# Patient Record
Sex: Female | Born: 1960 | Race: White | Hispanic: No | Marital: Married | Smoking: Never smoker
Health system: Southern US, Community
[De-identification: ages and names within clinical notes are randomized; demographics above are authoritative.]

## PROBLEM LIST (undated history)

## (undated) DIAGNOSIS — D649 Anemia, unspecified: Secondary | ICD-10-CM

## (undated) DIAGNOSIS — K219 Gastro-esophageal reflux disease without esophagitis: Secondary | ICD-10-CM

## (undated) DIAGNOSIS — D279 Benign neoplasm of unspecified ovary: Secondary | ICD-10-CM

## (undated) DIAGNOSIS — T7840XA Allergy, unspecified, initial encounter: Secondary | ICD-10-CM

## (undated) HISTORY — DX: Allergy, unspecified, initial encounter: T78.40XA

## (undated) HISTORY — DX: Benign neoplasm of unspecified ovary: D27.9

## (undated) HISTORY — DX: Anemia, unspecified: D64.9

## (undated) HISTORY — PX: ABDOMINAL HYSTERECTOMY: SHX81

## (undated) HISTORY — DX: Gastro-esophageal reflux disease without esophagitis: K21.9

---

## 2002-05-07 ENCOUNTER — Encounter: Admission: RE | Admit: 2002-05-07 | Discharge: 2002-05-07 | Payer: Self-pay | Admitting: Unknown Physician Specialty

## 2002-05-07 ENCOUNTER — Encounter: Payer: Self-pay | Admitting: Unknown Physician Specialty

## 2003-07-10 ENCOUNTER — Encounter: Admission: RE | Admit: 2003-07-10 | Discharge: 2003-07-10 | Payer: Self-pay | Admitting: Unknown Physician Specialty

## 2004-08-26 ENCOUNTER — Encounter: Admission: RE | Admit: 2004-08-26 | Discharge: 2004-08-26 | Payer: Self-pay | Admitting: Unknown Physician Specialty

## 2005-09-26 ENCOUNTER — Encounter: Admission: RE | Admit: 2005-09-26 | Discharge: 2005-09-26 | Payer: Self-pay | Admitting: Unknown Physician Specialty

## 2006-10-24 ENCOUNTER — Encounter: Admission: RE | Admit: 2006-10-24 | Discharge: 2006-10-24 | Payer: Self-pay | Admitting: Unknown Physician Specialty

## 2007-01-24 HISTORY — PX: OTHER SURGICAL HISTORY: SHX169

## 2007-10-28 ENCOUNTER — Encounter: Admission: RE | Admit: 2007-10-28 | Discharge: 2007-10-28 | Payer: Self-pay | Admitting: Unknown Physician Specialty

## 2008-11-19 ENCOUNTER — Encounter: Admission: RE | Admit: 2008-11-19 | Discharge: 2008-11-19 | Payer: Self-pay | Admitting: Unknown Physician Specialty

## 2009-06-01 DIAGNOSIS — C4491 Basal cell carcinoma of skin, unspecified: Secondary | ICD-10-CM

## 2009-06-01 HISTORY — DX: Basal cell carcinoma of skin, unspecified: C44.91

## 2009-11-29 ENCOUNTER — Encounter: Admission: RE | Admit: 2009-11-29 | Discharge: 2009-11-29 | Payer: Self-pay | Admitting: Unknown Physician Specialty

## 2010-11-15 ENCOUNTER — Other Ambulatory Visit: Payer: Self-pay | Admitting: Unknown Physician Specialty

## 2010-11-15 DIAGNOSIS — Z1231 Encounter for screening mammogram for malignant neoplasm of breast: Secondary | ICD-10-CM

## 2010-12-26 ENCOUNTER — Ambulatory Visit: Payer: Self-pay

## 2011-01-04 ENCOUNTER — Ambulatory Visit: Payer: Self-pay

## 2011-01-09 ENCOUNTER — Ambulatory Visit
Admission: RE | Admit: 2011-01-09 | Discharge: 2011-01-09 | Disposition: A | Discharge status: Home / Self Care | Payer: BC Managed Care – PPO | Source: Ambulatory Visit | Attending: Unknown Physician Specialty | Admitting: Unknown Physician Specialty

## 2011-01-09 DIAGNOSIS — Z1231 Encounter for screening mammogram for malignant neoplasm of breast: Secondary | ICD-10-CM

## 2011-10-23 ENCOUNTER — Other Ambulatory Visit: Payer: Self-pay | Admitting: Unknown Physician Specialty

## 2011-10-23 DIAGNOSIS — N644 Mastodynia: Secondary | ICD-10-CM

## 2011-10-23 DIAGNOSIS — N6320 Unspecified lump in the left breast, unspecified quadrant: Secondary | ICD-10-CM

## 2011-10-25 ENCOUNTER — Ambulatory Visit
Admission: RE | Admit: 2011-10-25 | Discharge: 2011-10-25 | Disposition: A | Discharge status: Home / Self Care | Payer: BC Managed Care – PPO | Source: Ambulatory Visit | Attending: Unknown Physician Specialty | Admitting: Unknown Physician Specialty

## 2011-10-25 DIAGNOSIS — N6320 Unspecified lump in the left breast, unspecified quadrant: Secondary | ICD-10-CM

## 2011-10-25 DIAGNOSIS — N644 Mastodynia: Secondary | ICD-10-CM

## 2011-12-27 ENCOUNTER — Other Ambulatory Visit: Payer: Self-pay | Admitting: Unknown Physician Specialty

## 2011-12-27 DIAGNOSIS — Z1231 Encounter for screening mammogram for malignant neoplasm of breast: Secondary | ICD-10-CM

## 2012-03-05 ENCOUNTER — Ambulatory Visit
Admission: RE | Admit: 2012-03-05 | Discharge: 2012-03-05 | Disposition: A | Discharge status: Home / Self Care | Payer: BC Managed Care – PPO | Source: Ambulatory Visit | Attending: Unknown Physician Specialty | Admitting: Unknown Physician Specialty

## 2012-03-05 DIAGNOSIS — Z1231 Encounter for screening mammogram for malignant neoplasm of breast: Secondary | ICD-10-CM

## 2012-12-24 ENCOUNTER — Other Ambulatory Visit: Payer: Self-pay

## 2012-12-24 DIAGNOSIS — Z1231 Encounter for screening mammogram for malignant neoplasm of breast: Secondary | ICD-10-CM

## 2013-03-06 ENCOUNTER — Ambulatory Visit: Payer: BC Managed Care – PPO

## 2013-03-10 ENCOUNTER — Ambulatory Visit
Admission: RE | Admit: 2013-03-10 | Discharge: 2013-03-10 | Disposition: A | Discharge status: Home / Self Care | Payer: BC Managed Care – PPO | Source: Ambulatory Visit

## 2013-03-10 ENCOUNTER — Ambulatory Visit: Payer: BC Managed Care – PPO

## 2013-03-10 DIAGNOSIS — Z1231 Encounter for screening mammogram for malignant neoplasm of breast: Secondary | ICD-10-CM

## 2013-03-13 ENCOUNTER — Other Ambulatory Visit: Payer: Self-pay

## 2013-03-13 DIAGNOSIS — Z1231 Encounter for screening mammogram for malignant neoplasm of breast: Secondary | ICD-10-CM

## 2014-03-02 ENCOUNTER — Other Ambulatory Visit: Payer: Self-pay

## 2014-03-02 DIAGNOSIS — Z1231 Encounter for screening mammogram for malignant neoplasm of breast: Secondary | ICD-10-CM

## 2014-03-16 ENCOUNTER — Ambulatory Visit
Admission: RE | Admit: 2014-03-16 | Discharge: 2014-03-16 | Disposition: A | Discharge status: Home / Self Care | Payer: BC Managed Care – PPO | Source: Ambulatory Visit

## 2014-03-16 ENCOUNTER — Encounter (INDEPENDENT_AMBULATORY_CARE_PROVIDER_SITE_OTHER): Payer: Self-pay

## 2014-03-16 DIAGNOSIS — Z1231 Encounter for screening mammogram for malignant neoplasm of breast: Secondary | ICD-10-CM

## 2014-05-24 HISTORY — PX: COLONOSCOPY: SHX174

## 2014-06-05 ENCOUNTER — Ambulatory Visit
Admission: RE | Admit: 2014-06-05 | Discharge: 2014-06-05 | Disposition: A | Discharge status: Home / Self Care | Payer: BC Managed Care – PPO | Source: Ambulatory Visit | Attending: Otolaryngology | Admitting: Otolaryngology

## 2014-06-05 ENCOUNTER — Other Ambulatory Visit: Payer: Self-pay | Admitting: Otolaryngology

## 2014-06-05 DIAGNOSIS — H9311 Tinnitus, right ear: Secondary | ICD-10-CM

## 2014-06-05 MED ORDER — GADOBENATE DIMEGLUMINE 529 MG/ML IV SOLN
15.0000 mL | Freq: Once | INTRAVENOUS | Status: AC | PRN
  Administered 2014-06-05: 15 mL via INTRAVENOUS

## 2014-06-10 ENCOUNTER — Other Ambulatory Visit: Payer: BC Managed Care – PPO

## 2015-03-04 ENCOUNTER — Other Ambulatory Visit: Payer: Self-pay

## 2015-03-04 DIAGNOSIS — Z1231 Encounter for screening mammogram for malignant neoplasm of breast: Secondary | ICD-10-CM

## 2015-03-26 ENCOUNTER — Ambulatory Visit
Admission: RE | Admit: 2015-03-26 | Discharge: 2015-03-26 | Disposition: A | Discharge status: Home / Self Care | Payer: BC Managed Care – PPO | Source: Ambulatory Visit

## 2015-03-26 DIAGNOSIS — Z1231 Encounter for screening mammogram for malignant neoplasm of breast: Secondary | ICD-10-CM

## 2016-04-04 ENCOUNTER — Other Ambulatory Visit: Payer: Self-pay | Admitting: Unknown Physician Specialty

## 2016-04-04 ENCOUNTER — Other Ambulatory Visit: Payer: Self-pay | Admitting: Internal Medicine

## 2016-04-04 DIAGNOSIS — Z1231 Encounter for screening mammogram for malignant neoplasm of breast: Secondary | ICD-10-CM

## 2016-04-25 ENCOUNTER — Ambulatory Visit
Admission: RE | Admit: 2016-04-25 | Discharge: 2016-04-25 | Disposition: A | Discharge status: Home / Self Care | Payer: BC Managed Care – PPO | Source: Ambulatory Visit | Attending: Unknown Physician Specialty | Admitting: Unknown Physician Specialty

## 2016-04-25 DIAGNOSIS — Z1231 Encounter for screening mammogram for malignant neoplasm of breast: Secondary | ICD-10-CM

## 2017-03-19 ENCOUNTER — Other Ambulatory Visit: Payer: Self-pay | Admitting: Unknown Physician Specialty

## 2017-03-19 DIAGNOSIS — Z1231 Encounter for screening mammogram for malignant neoplasm of breast: Secondary | ICD-10-CM

## 2017-04-30 ENCOUNTER — Ambulatory Visit
Admission: RE | Admit: 2017-04-30 | Discharge: 2017-04-30 | Disposition: A | Discharge status: Home / Self Care | Payer: BC Managed Care – PPO | Source: Ambulatory Visit | Attending: Unknown Physician Specialty | Admitting: Unknown Physician Specialty

## 2017-04-30 DIAGNOSIS — Z1231 Encounter for screening mammogram for malignant neoplasm of breast: Secondary | ICD-10-CM

## 2017-05-01 ENCOUNTER — Ambulatory Visit: Payer: BC Managed Care – PPO

## 2017-10-05 ENCOUNTER — Other Ambulatory Visit: Payer: Self-pay | Admitting: Unknown Physician Specialty

## 2017-10-05 DIAGNOSIS — N6320 Unspecified lump in the left breast, unspecified quadrant: Secondary | ICD-10-CM

## 2017-10-08 ENCOUNTER — Ambulatory Visit
Admission: RE | Admit: 2017-10-08 | Discharge: 2017-10-08 | Disposition: A | Discharge status: Home / Self Care | Payer: BC Managed Care – PPO | Source: Ambulatory Visit | Attending: Unknown Physician Specialty | Admitting: Unknown Physician Specialty

## 2017-10-08 DIAGNOSIS — N6320 Unspecified lump in the left breast, unspecified quadrant: Secondary | ICD-10-CM

## 2017-12-17 ENCOUNTER — Encounter (INDEPENDENT_AMBULATORY_CARE_PROVIDER_SITE_OTHER): Payer: Self-pay | Admitting: Internal Medicine

## 2017-12-17 ENCOUNTER — Ambulatory Visit (INDEPENDENT_AMBULATORY_CARE_PROVIDER_SITE_OTHER): Payer: BC Managed Care – PPO | Admitting: Internal Medicine

## 2017-12-17 ENCOUNTER — Other Ambulatory Visit (INDEPENDENT_AMBULATORY_CARE_PROVIDER_SITE_OTHER): Payer: Self-pay | Admitting: *Deleted

## 2017-12-17 ENCOUNTER — Encounter (INDEPENDENT_AMBULATORY_CARE_PROVIDER_SITE_OTHER): Payer: Self-pay | Admitting: *Deleted

## 2017-12-17 VITALS — BP 118/80 | HR 70 | Temp 99.4°F | Resp 18 | Ht 64.0 in | Wt 147.2 lb

## 2017-12-17 DIAGNOSIS — Z8349 Family history of other endocrine, nutritional and metabolic diseases: Secondary | ICD-10-CM

## 2017-12-17 DIAGNOSIS — Z8379 Family history of other diseases of the digestive system: Secondary | ICD-10-CM

## 2017-12-17 NOTE — Patient Instructions (Signed)
Ultrasound of upper abdomen to be scheduled. Physician will call with results of blood test when completed.

## 2017-12-17 NOTE — Progress Notes (Signed)
Presenting complaint;  Family history of liver disease and hemochromatosis.  History of present illness:  Patient is 57 year old Caucasian female who is self-referred for GI evaluation.  She recently found out that 3 of her first cousins had cirrhosis in 1 of them also had hemochromatosis diagnosed on liver biopsy done because of liver lesions determined to be due to primary esophageal adenocarcinoma.  She just died she was 57 years old. Etiology of cirrhosis has not been is established and her other cousins.  One of her cousins is in her 61s.  She states another cousin of her was diagnosed with another genetic disorder she does not have liver disease. He has kept records of her blood work for close to 15 years.  She has had anemia dating back to age 56 possibly related to toxin exposure.  Her transaminases have always been normal.  She realizes that she does not have hemochromatosis but she wants to find out if she carries single HFE which may be important information for her children. Her last menstrual period was in 2009. Her appetite is normal.  She retired in January this year and since then she has lost 20 pounds.  She states she has lost weight because of this of regular exercise and her eating habits have improved.  She denies heartburn dysphagia nausea vomiting abdominal pain melena or rectal bleeding.  She has occasional urgency and had at least one accident about 2 years ago when her father was ill.  She has never been diagnosed with irritable bowel syndrome. Last colonoscopy was in 2016 with removal of single adenoma.  She was advised to return for surveillance exam in 5 years or 2021.  Current Medications: No outpatient encounter medications on file as of 12/17/2017.   No facility-administered encounter medications on file as of 12/17/2017.    Past medical history: History of anemia dating back to age 57. She had pseudomembranous colitis back in 1972. C-section in 1985. She had  endometrial ablation in 2009. Screening colonoscopy in May 2016 with removal of single adenoma.  Allergies: Allergies  Allergen Reactions  . Penicillins Rash    Family history: Father lived to be 78 and died 2 years ago of MI.  Mother is 1 years old.  She lives at home.  She has spinal stenosis osteoarthrosis peripheral vascular disease and benign monoclonal gammopathy. Patient does not have any siblings.  Paternal aunt died of colorectal carcinoma in her 59s. One first cousin was diagnosed with esophageal carcinoma metastatic to liver and diet at 60 this year.  She had liver biopsy and was found to have hemochromatosis.  Her daughter was tested for hemochromatosis and tests were negative. She has 2 other cousins with cirrhosis.(One is in her 13s and the other one is 59 years old). Another first cousin has H63D mutation but does not have full-blown disorder.  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  Prior to that she worked for 11 years at [employer] and she also worked 7 years elsewhere. She is married.  Her husband is in good health.  They have 2 sons ages 25 and 67 and they are in good health.  She has never smoked cigarettes and drinks alcohol occasionally. Since she has retired she has been jogging and walking regularly and she also does aerobic exercises and Zumba.     Objective: Blood pressure 118/80, pulse 70, temperature 99.4 F (  37.4 C), temperature source Oral, resp. rate 18, height 5\' 4"  (1.626 m), weight 147 lb 3.2 oz (66.8 kg). Patient is alert and in no acute distress. Conjunctiva is pink. Sclera is nonicteric Oropharyngeal mucosa is normal. No neck masses or thyromegaly noted. Cardiac exam with regular rhythm normal S1 and S2. No murmur or gallop noted. Lungs are clear to auscultation. Abdomen is symmetrical.  On palpation is soft and nontender without masses  organomegaly. No LE edema or clubbing noted.  Labs/studies Results:  Serum iron levels are as follows: 48 in 2005, 35 in 2006, 30 in 2007, 60 in 2008, 56 in 2009, 61 in 2010, 95 in 2011, 74 in 2012, 57 in 2013 and 73 in 2014.  Recent lab data from 04/04/2016 WBC 4.8, H&H 11 and 33.9 and platelet count 371K.  LFTs from 04/14/2014 Bilirubin 0.4, AP 59, AST 16, ALT 12, total protein 6.2 and albumin 4.0  She has LFTs going back to February 2005 and they have always been normal.  Patient has these records.  Assessment:  #1.  Family history of chronic liver disease as well as hemochromatosis.  Patient does not have stigmata of chronic liver disease.  Serum iron levels have been normal.  She did receive hepatitis B vaccination.  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  We will also check iron studies and proceed with abdominal ultrasound and genetic testing as it is important to know for the sake of her children.  I do not believe she has hemochromatosis based on all the blood work that she has had over the last 14 years.  Given her family history she could have single mutation but it would not be enough to cause disease.  #2.  History of colonic adenoma.  She will be due for next surveillance colonoscopy in May 2021.   Plan:  Upper abdominal ultrasound. Hepatitis B surface antigen, hepatitis B surface antibody and HCV antibody. Genetic testing for hemochromatosis. Office visit in 1 year.

## 2017-12-18 ENCOUNTER — Ambulatory Visit (INDEPENDENT_AMBULATORY_CARE_PROVIDER_SITE_OTHER): Payer: BC Managed Care – PPO | Admitting: Internal Medicine

## 2017-12-23 LAB — HEPATITIS C ANTIBODY
Hepatitis C Ab: NONREACTIVE
SIGNAL TO CUT-OFF: 0.01 (ref <1.00)

## 2017-12-23 LAB — HEPATITIS B SURFACE ANTIBODY,QUALITATIVE: Hep B S Ab: REACTIVE — AB

## 2017-12-23 LAB — HEMOCHROMATOSIS DNA-PCR(C282Y,H63D)

## 2017-12-23 LAB — HEPATITIS B SURFACE ANTIGEN: Hepatitis B Surface Ag: NONREACTIVE

## 2017-12-25 ENCOUNTER — Ambulatory Visit (HOSPITAL_COMMUNITY)
Admission: RE | Admit: 2017-12-25 | Discharge: 2017-12-25 | Disposition: A | Discharge status: Home / Self Care | Payer: BC Managed Care – PPO | Source: Ambulatory Visit | Attending: Internal Medicine | Admitting: Internal Medicine

## 2017-12-25 DIAGNOSIS — Z8379 Family history of other diseases of the digestive system: Secondary | ICD-10-CM | POA: Diagnosis not present

## 2017-12-25 DIAGNOSIS — R1907 Generalized intra-abdominal and pelvic swelling, mass and lump: Secondary | ICD-10-CM | POA: Insufficient documentation

## 2017-12-25 DIAGNOSIS — Z8349 Family history of other endocrine, nutritional and metabolic diseases: Secondary | ICD-10-CM | POA: Insufficient documentation

## 2017-12-28 ENCOUNTER — Other Ambulatory Visit (INDEPENDENT_AMBULATORY_CARE_PROVIDER_SITE_OTHER): Payer: Self-pay | Admitting: Internal Medicine

## 2017-12-28 ENCOUNTER — Telehealth (INDEPENDENT_AMBULATORY_CARE_PROVIDER_SITE_OTHER): Payer: Self-pay | Admitting: *Deleted

## 2017-12-28 DIAGNOSIS — R1907 Generalized intra-abdominal and pelvic swelling, mass and lump: Secondary | ICD-10-CM

## 2017-12-28 NOTE — Telephone Encounter (Signed)
Patient wants to know if you need to discuss [place] results with Dr Barrie Dunker to see if she needs pelvic US

## 2017-12-31 ENCOUNTER — Ambulatory Visit (HOSPITAL_COMMUNITY)
Admission: RE | Admit: 2017-12-31 | Discharge: 2017-12-31 | Disposition: A | Discharge status: Home / Self Care | Payer: BC Managed Care – PPO | Source: Ambulatory Visit | Attending: Internal Medicine | Admitting: Internal Medicine

## 2017-12-31 DIAGNOSIS — R1907 Generalized intra-abdominal and pelvic swelling, mass and lump: Secondary | ICD-10-CM | POA: Insufficient documentation

## 2017-12-31 MED ORDER — IOPAMIDOL (ISOVUE-300) INJECTION 61%
100.0000 mL | Freq: Once | INTRAVENOUS | Status: AC | PRN
  Administered 2017-12-31: 100 mL via INTRAVENOUS

## 2018-01-06 NOTE — Telephone Encounter (Signed)
Already addressed with patient. 

## 2018-03-05 ENCOUNTER — Telehealth (INDEPENDENT_AMBULATORY_CARE_PROVIDER_SITE_OTHER): Payer: Self-pay | Admitting: Internal Medicine

## 2018-03-05 NOTE — Telephone Encounter (Signed)
Patient called stated she had a CT scan and the results showed some liver lesions - stated she doesn't know if she should follow up with you.  Ph# [phone]

## 2018-03-08 NOTE — Telephone Encounter (Signed)
Patient called. She had a tiny lesion in the liver possibly small cyst.  No further work-up indicated at this time. He had abdominal hysterectomy with oophorectomy.  She had a very large cystadenoma of the ovary. She will be returning for office visit in November this year.

## 2018-03-18 ENCOUNTER — Other Ambulatory Visit: Payer: Self-pay | Admitting: Unknown Physician Specialty

## 2018-03-18 DIAGNOSIS — Z1231 Encounter for screening mammogram for malignant neoplasm of breast: Secondary | ICD-10-CM

## 2018-03-20 ENCOUNTER — Ambulatory Visit: Payer: BC Managed Care – PPO | Admitting: Internal Medicine

## 2018-03-20 ENCOUNTER — Encounter: Payer: Self-pay | Admitting: Internal Medicine

## 2018-03-20 VITALS — BP 120/78 | HR 95 | Temp 98.9°F | Ht 65.0 in | Wt 140.3 lb

## 2018-03-20 DIAGNOSIS — D279 Benign neoplasm of unspecified ovary: Secondary | ICD-10-CM

## 2018-03-20 DIAGNOSIS — Z23 Encounter for immunization: Secondary | ICD-10-CM

## 2018-03-20 NOTE — Patient Instructions (Signed)
-  Nice meeting you today!  -1/2 shingles vaccines today.  -Schedule follow up in 3 months for your physical. Please come in fasting to that appointment.

## 2018-03-20 NOTE — Addendum Note (Signed)
Addended by: Westley Hummer B on: 03/20/2018 02:12 PM   Modules accepted: Orders

## 2018-03-20 NOTE — Progress Notes (Signed)
New Patient Office Visit     CC/Reason for Visit: Establish care, follow up on chronic conditions Previous PCP: [facility] in [place], [place] Last Visit: Fall 2019  HPI: Danielle Gutierrez is a 58 y.o. female who is coming in today for the above mentioned reasons. 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 Over the past year she has lost over 20 pounds with lifestyle modifications. She takes no medications and has no chronic medical issues. Never smoker, ETOH occasionally (1 glass of wine a week), no illicit substances. 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 Fam Hx is significant for HTN and HLD in father, multiple 1st cousins with cirrhosis, 1 of them diagnosed with hemachromatosis, 1 cousin passed with esophageal cancer. She has no acute complaints today.  Past Medical/Surgical History: Past Medical History:  Diagnosis Date  . Serous cystadenoma of ovary     Past Surgical History:  Procedure Laterality Date  . CESAREAN SECTION  1985  . COLONOSCOPY  05/2014   [facility]  . Uterine ablasion  2009  2009    Social History:  reports that she has never smoked. She has never used smokeless tobacco. She reports current alcohol use. She reports that she does not use drugs.  Allergies: Allergies  Allergen Reactions  . Penicillins Rash    Family History:  Family History  Adopted: Yes  Problem Relation Age of Onset  . Breast cancer Maternal Aunt 75  . Arthritis Mother   . Peripheral vascular disease Mother      Current Outpatient Medications:  .  fluticasone (FLONASE) 50 MCG/ACT nasal spray, Place into the nose., Disp: , Rfl:   Review of Systems:  Constitutional: Denies fever, chills, diaphoresis, appetite change and fatigue.  HEENT: Denies photophobia, eye pain, redness, hearing loss, ear pain, congestion, sore throat,  rhinorrhea, sneezing, mouth sores, trouble swallowing, neck pain, neck stiffness and tinnitus.   Respiratory: Denies SOB, DOE, cough, chest tightness,  and wheezing.   Cardiovascular: Denies chest pain, palpitations and leg swelling.  Gastrointestinal: Denies nausea, vomiting, abdominal pain, diarrhea, constipation, blood in stool and abdominal distention.  Genitourinary: Denies dysuria, urgency, frequency, hematuria, flank pain and difficulty urinating.  Endocrine: Denies: hot or cold intolerance, sweats, changes in hair or nails, polyuria, polydipsia. Musculoskeletal: Denies myalgias, back pain, joint swelling, arthralgias and gait problem.  Skin: Denies pallor, rash and wound.  Neurological: Denies dizziness, seizures, syncope, weakness, light-headedness, numbness and headaches.  Hematological: Denies adenopathy. Easy bruising, personal or family bleeding history  Psychiatric/Behavioral: Denies suicidal ideation, mood changes, confusion, nervousness, sleep disturbance and agitation    Physical Exam: Vitals:   03/20/18 1254  BP: 120/78  Pulse: 95  Temp: 98.9 F (37.2 C)  TempSrc: Oral  SpO2: 97%  Weight: 140 lb 4.8 oz (63.6 kg)  Height: 5\' 5"  (1.651 m)   Body mass index is 23.35 kg/m.  Constitutional: NAD, calm, comfortable Eyes: PERRL, lids and conjunctivae normal ENMT: Mucous membranes are moist.  Respiratory: clear to auscultation bilaterally, no wheezing, no crackles. Normal respiratory effort. No accessory muscle use.  Cardiovascular: Regular rate and rhythm, no murmurs / rubs / gallops. No extremity edema. 2+ pedal pulses. No carotid bruits.  Psychiatric: Normal judgment and insight. Alert and oriented x 3. Normal mood.    Impression and Plan:  Serous cystadenoma of ovary, unspecified laterality -  Removed in 12/19. -Path was benign.  She will receive 1/2 shingles vaccines today. Will return in 3 months for CPE.     Patient Instructions  -Nice meeting you  today!  -1/2 shingles vaccines today.  -Schedule follow up in 3 months for your physical. Please come in fasting to that appointment.     Lelon Frohlich, MD [facility]

## 2018-05-02 ENCOUNTER — Ambulatory Visit: Payer: BC Managed Care – PPO

## 2018-05-22 ENCOUNTER — Ambulatory Visit: Payer: BC Managed Care – PPO

## 2018-06-25 ENCOUNTER — Encounter: Payer: BC Managed Care – PPO | Admitting: Internal Medicine

## 2018-06-26 ENCOUNTER — Encounter: Payer: Self-pay | Admitting: Internal Medicine

## 2018-06-26 ENCOUNTER — Other Ambulatory Visit: Payer: Self-pay

## 2018-06-26 ENCOUNTER — Ambulatory Visit (INDEPENDENT_AMBULATORY_CARE_PROVIDER_SITE_OTHER): Payer: BC Managed Care – PPO | Admitting: Internal Medicine

## 2018-06-26 VITALS — BP 102/68 | HR 58 | Temp 97.5°F | Ht 65.0 in | Wt 142.8 lb

## 2018-06-26 DIAGNOSIS — Z23 Encounter for immunization: Secondary | ICD-10-CM

## 2018-06-26 DIAGNOSIS — Z8249 Family history of ischemic heart disease and other diseases of the circulatory system: Secondary | ICD-10-CM

## 2018-06-26 DIAGNOSIS — Z13820 Encounter for screening for osteoporosis: Secondary | ICD-10-CM | POA: Diagnosis not present

## 2018-06-26 DIAGNOSIS — Z0000 Encounter for general adult medical examination without abnormal findings: Secondary | ICD-10-CM | POA: Diagnosis not present

## 2018-06-26 DIAGNOSIS — Z148 Genetic carrier of other disease: Secondary | ICD-10-CM | POA: Diagnosis not present

## 2018-06-26 LAB — COMPREHENSIVE METABOLIC PANEL
ALT: 15 U/L (ref 0–35)
AST: 19 U/L (ref 0–37)
Albumin: 4.2 g/dL (ref 3.5–5.2)
Alkaline Phosphatase: 51 U/L (ref 39–117)
BUN: 14 mg/dL (ref 6–23)
CO2: 27 mEq/L (ref 19–32)
Calcium: 9.4 mg/dL (ref 8.4–10.5)
Chloride: 103 mEq/L (ref 96–112)
Creatinine, Ser: 0.9 mg/dL (ref 0.40–1.20)
GFR: 63.9 mL/min (ref >60.00)
Glucose, Bld: 90 mg/dL (ref 70–99)
Potassium: 4.4 mEq/L (ref 3.5–5.1)
Sodium: 138 mEq/L (ref 135–145)
Total Bilirubin: 1.1 mg/dL (ref 0.2–1.2)
Total Protein: 6.8 g/dL (ref 6.0–8.3)

## 2018-06-26 LAB — CBC WITH DIFFERENTIAL/PLATELET
Basophils Absolute: 0 10*3/uL (ref 0.0–0.1)
Basophils Relative: 0.3 % (ref 0.0–3.0)
Eosinophils Absolute: 0.1 10*3/uL (ref 0.0–0.7)
Eosinophils Relative: 1.6 % (ref 0.0–5.0)
HCT: 34.9 % — ABNORMAL LOW (ref 36.0–46.0)
Hemoglobin: 11.8 g/dL — ABNORMAL LOW (ref 12.0–15.0)
Lymphocytes Relative: 31.7 % (ref 12.0–46.0)
Lymphs Abs: 1.9 10*3/uL (ref 0.7–4.0)
MCHC: 33.8 g/dL (ref 30.0–36.0)
MCV: 94.6 fl (ref 78.0–100.0)
Monocytes Absolute: 0.5 10*3/uL (ref 0.1–1.0)
Monocytes Relative: 7.6 % (ref 3.0–12.0)
Neutro Abs: 3.6 10*3/uL (ref 1.4–7.7)
Neutrophils Relative %: 58.8 % (ref 43.0–77.0)
Platelets: 367 10*3/uL (ref 150.0–400.0)
RBC: 3.69 Mil/uL — ABNORMAL LOW (ref 3.87–5.11)
RDW: 12.8 % (ref 11.5–15.5)
WBC: 6 10*3/uL (ref 4.0–10.5)

## 2018-06-26 LAB — VITAMIN B12: Vitamin B-12: 219 pg/mL (ref 211–911)

## 2018-06-26 LAB — LIPID PANEL
Cholesterol: 212 mg/dL — ABNORMAL HIGH (ref 0–200)
HDL: 77.2 mg/dL (ref >39.00)
LDL Cholesterol: 120 mg/dL — ABNORMAL HIGH (ref 0–99)
NonHDL: 135.13
Total CHOL/HDL Ratio: 3
Triglycerides: 75 mg/dL (ref 0.0–149.0)
VLDL: 15 mg/dL (ref 0.0–40.0)

## 2018-06-26 LAB — VITAMIN D 25 HYDROXY (VIT D DEFICIENCY, FRACTURES): VITD: 35.77 ng/mL (ref 30.00–100.00)

## 2018-06-26 LAB — TSH: TSH: 1.07 u[IU]/mL (ref 0.35–4.50)

## 2018-06-26 NOTE — Addendum Note (Signed)
Addended by: Elmer Picker on: 06/26/2018 10:02 AM   Modules accepted: Orders

## 2018-06-26 NOTE — Patient Instructions (Signed)
-It was nice seeing you today!  -Lab work today; will notify you when results are available.  -Bone density test will be requested.  -Make sure you have routine eye and dental care.  -Second singles vaccine today.   Preventive Care 40-64 Years, Female Preventive care refers to lifestyle choices and visits with your health care provider that can promote health and wellness. What does preventive care include?   A yearly physical exam. This is also called an annual well check.  Dental exams once or twice a year.  Routine eye exams. Ask your health care provider how often you should have your eyes checked.  Personal lifestyle choices, including: ? Daily care of your teeth and gums. ? Regular physical activity. ? Eating a healthy diet. ? Avoiding tobacco and drug use. ? Limiting alcohol use. ? Practicing safe sex. ? Taking low-dose aspirin daily starting at age 24. ? Taking vitamin and mineral supplements as recommended by your health care provider. What happens during an annual well check? The services and screenings done by your health care provider during your annual well check will depend on your age, overall health, lifestyle risk factors, and family history of disease. Counseling Your health care provider may ask you questions about your:  Alcohol use.  Tobacco use.  Drug use.  Emotional well-being.  Home and relationship well-being.  Sexual activity.  Eating habits.  Work and work Statistician.  Method of birth control.  Menstrual cycle.  Pregnancy history. Screening You may have the following tests or measurements:  Height, weight, and BMI.  Blood pressure.  Lipid and cholesterol levels. These may be checked every 5 years, or more frequently if you are over 24 years old.  Skin check.  Lung cancer screening. You may have this screening every year starting at age 27 if you have a 30-pack-year history of smoking and currently smoke or have quit  within the past 15 years.  Colorectal cancer screening. All adults should have this screening starting at age 72 and continuing until age 20. Your health care provider may recommend screening at age 59. You will have tests every 1-10 years, depending on your results and the type of screening test. People at increased risk should start screening at an earlier age. Screening tests may include: ? Guaiac-based fecal occult blood testing. ? Fecal immunochemical test (FIT). ? Stool DNA test. ? Virtual colonoscopy. ? Sigmoidoscopy. During this test, a flexible tube with a tiny camera (sigmoidoscope) is used to examine your rectum and lower colon. The sigmoidoscope is inserted through your anus into your rectum and lower colon. ? Colonoscopy. During this test, a long, thin, flexible tube with a tiny camera (colonoscope) is used to examine your entire colon and rectum.  Hepatitis C blood test.  Hepatitis B blood test.  Sexually transmitted disease (STD) testing.  Diabetes screening. This is done by checking your blood sugar (glucose) after you have not eaten for a while (fasting). You may have this done every 1-3 years.  Mammogram. This may be done every 1-2 years. Talk to your health care provider about when you should start having regular mammograms. This may depend on whether you have a family history of breast cancer.  BRCA-related cancer screening. This may be done if you have a family history of breast, ovarian, tubal, or peritoneal cancers.  Pelvic exam and Pap test. This may be done every 3 years starting at age 41. Starting at age 76, this may be done every 5 years if  you have a Pap test in combination with an HPV test.  Bone density scan. This is done to screen for osteoporosis. You may have this scan if you are at high risk for osteoporosis. Discuss your test results, treatment options, and if necessary, the need for more tests with your health care provider. Vaccines Your health care  provider may recommend certain vaccines, such as:  Influenza vaccine. This is recommended every year.  Tetanus, diphtheria, and acellular pertussis (Tdap, Td) vaccine. You may need a Td booster every 10 years.  Varicella vaccine. You may need this if you have not been vaccinated.  Zoster vaccine. You may need this after age 44.  Measles, mumps, and rubella (MMR) vaccine. You may need at least one dose of MMR if you were born in 1957 or later. You may also need a second dose.  Pneumococcal 13-valent conjugate (PCV13) vaccine. You may need this if you have certain conditions and were not previously vaccinated.  Pneumococcal polysaccharide (PPSV23) vaccine. You may need one or two doses if you smoke cigarettes or if you have certain conditions.  Meningococcal vaccine. You may need this if you have certain conditions.  Hepatitis A vaccine. You may need this if you have certain conditions or if you travel or work in places where you may be exposed to hepatitis A.  Hepatitis B vaccine. You may need this if you have certain conditions or if you travel or work in places where you may be exposed to hepatitis B.  Haemophilus influenzae type b (Hib) vaccine. You may need this if you have certain conditions. Talk to your health care provider about which screenings and vaccines you need and how often you need them. This information is not intended to replace advice given to you by your health care provider. Make sure you discuss any questions you have with your health care provider. Document Released: 02/05/2015 Document Revised: 03/01/2017 Document Reviewed: 11/10/2014 Elsevier Interactive Patient Education  2019 [employer].

## 2018-06-26 NOTE — Addendum Note (Signed)
Addended by: Westley Hummer B on: 06/26/2018 11:06 AM   Modules accepted: Orders

## 2018-06-26 NOTE — Progress Notes (Signed)
Established Patient Office Visit     CC/Reason for Visit: Annual preventive exam  HPI: Danielle Gutierrez is a 58 y.o. female who is coming in today for the above mentioned reasons. 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  She wonders what implications this has in her family members as she is expecting her first grandchild soon.  Other than that she has no acute complaints.  She has routine eye and dental care, she is due for screening mammogram in July 2020, all immunizations are up-to-date, due for repeat colonoscopy in 2021.  She is going through a difficult and stressful situation with her mother who is ill.   Past Medical/Surgical History: Past Medical History:  Diagnosis Date  . Serous cystadenoma of ovary     Past Surgical History:  Procedure Laterality Date  . CESAREAN SECTION  1985  . COLONOSCOPY  05/2014   [facility]  . Uterine ablasion  2009  2009    Social History:  reports that she has never smoked. She has never used smokeless tobacco. She reports current alcohol use. She reports that she does not use drugs.  Allergies: Allergies  Allergen Reactions  . Penicillins Rash    Family History:  Family History  Adopted: Yes  Problem Relation Age of Onset  . Breast cancer Maternal Aunt 75  . Arthritis Mother   . Peripheral vascular disease Mother      Current Outpatient Medications:  .  fluticasone (FLONASE) 50 MCG/ACT nasal spray, Place into the nose., Disp: , Rfl:   Review of Systems:  Constitutional: Denies fever, chills, diaphoresis, appetite change and fatigue.  HEENT: Denies photophobia, eye pain, redness, hearing loss, ear pain, congestion, sore throat, rhinorrhea, sneezing, mouth sores, trouble swallowing, neck pain, neck stiffness and tinnitus.    Respiratory: Denies SOB, DOE, cough, chest tightness,  and wheezing.   Cardiovascular: Denies chest pain, palpitations and leg swelling.  Gastrointestinal: Denies nausea, vomiting, abdominal pain, diarrhea, constipation, blood in stool and abdominal distention.  Genitourinary: Denies dysuria, urgency, frequency, hematuria, flank pain and difficulty urinating.  Endocrine: Denies: hot or cold intolerance, sweats, changes in hair or nails, polyuria, polydipsia. Musculoskeletal: Denies myalgias, back pain, joint swelling, arthralgias and gait problem.  Skin: Denies pallor, rash and wound.  Neurological: Denies dizziness, seizures, syncope, weakness, light-headedness, numbness and headaches.  Hematological: Denies adenopathy. Easy bruising, personal or family bleeding history  Psychiatric/Behavioral: Denies suicidal ideation, mood changes, confusion, nervousness, sleep disturbance and agitation    Physical Exam: Vitals:   06/26/18 0920  BP: 102/68  Pulse: (!) 58  Temp: (!) 97.5 F (36.4 C)  TempSrc: Oral  SpO2: 99%  Weight: 142 lb 12.8 oz (64.8 kg)  Height: _0  (1.651 m)    Body mass index is 23.76 kg/m.   Constitutional: NAD, calm, comfortable Eyes: PERRL, lids and conjunctivae normal ENMT: Mucous membranes are moist. Posterior pharynx clear of any exudate or lesions. Normal dentition. Tympanic membrane is pearly white, no erythema or bulging. Neck: normal, supple, no masses, no thyromegaly Respiratory: clear to auscultation bilaterally, no wheezing, no crackles. Normal respiratory effort. No accessory muscle use.  Cardiovascular: Regular rate and rhythm, no murmurs / rubs / gallops. No extremity edema. 2+ pedal pulses. No carotid bruits.  Abdomen: no tenderness, no masses palpated. No hepatosplenomegaly. Bowel sounds positive.  Musculoskeletal:  no clubbing / cyanosis. No joint deformity upper and lower extremities. Good ROM, no contractures. Normal muscle tone.  Skin: no rashes,  lesions, ulcers. No induration Neurologic: CN 2-12 grossly intact. Sensation intact, DTR normal. Strength 5/5 in all 4.  Psychiatric: Normal judgment and insight. Alert and oriented x 3. Normal mood.    Impression and Plan:  Encounter for preventive health examination  -Screening labs to be done today. -DEXA scan requested. -Up-to-date on immunizations and cancer screening. -She has routine eye and dental care. -We have discussed healthy lifestyle choices.  Screening for osteoporosis - Plan: DG Bone Density, VITAMIN D 25 Hydroxy (Vit-D Deficiency, Fractures)  Hemochromatosis carrier -Have discussed that she and her family may benefit from some genetic counseling.     Patient Instructions  -It was nice seeing you today!  -Lab work today; will notify you when results are available.  -Bone density test will be requested.  -Make sure you have routine eye and dental care.  -Second singles vaccine today.   Preventive Care 40-64 Years, Female Preventive care refers to lifestyle choices and visits with your health care provider that can promote health and wellness. What does preventive care include?   A yearly physical exam. This is also called an annual well check.  Dental exams once or twice a year.  Routine eye exams. Ask your health care provider how often you should have your eyes checked.  Personal lifestyle choices, including: ? Daily care of your teeth and gums. ? Regular physical activity. ? Eating a healthy diet. ? Avoiding tobacco and drug use. ? Limiting alcohol use. ? Practicing safe sex. ? Taking low-dose aspirin daily starting at age 18. ? Taking vitamin and mineral supplements as recommended by your health care provider. What happens during an annual well check? The services and screenings done by your health care provider during your annual well check will depend on your age, overall health, lifestyle risk factors, and family history of disease.  Counseling Your health care provider may ask you questions about your:  Alcohol use.  Tobacco use.  Drug use.  Emotional well-being.  Home and relationship well-being.  Sexual activity.  Eating habits.  Work and work Statistician.  Method of birth control.  Menstrual cycle.  Pregnancy history. Screening You may have the following tests or measurements:  Height, weight, and BMI.  Blood pressure.  Lipid and cholesterol levels. These may be checked every 5 years, or more frequently if you are over 80 years old.  Skin check.  Lung cancer screening. You may have this screening every year starting at age 82 if you have a 30-pack-year history of smoking and currently smoke or have quit within the past 15 years.  Colorectal cancer screening. All adults should have this screening starting at age 69 and continuing until age 30. Your health care provider may recommend screening at age 2. You will have tests every 1-10 years, depending on your results and the type of screening test. People at increased risk should start screening at an earlier age. Screening tests may include: ? Guaiac-based fecal occult blood testing. ? Fecal immunochemical test (FIT). ? Stool DNA test. ? Virtual colonoscopy. ? Sigmoidoscopy. During this test, a flexible tube with a tiny camera (sigmoidoscope) is used to examine your rectum and lower colon. The sigmoidoscope is inserted through your anus into your rectum and lower colon. ? Colonoscopy. During this test, a long, thin, flexible tube with a tiny camera (colonoscope) is used to examine your entire  colon and rectum.  Hepatitis C blood test.  Hepatitis B blood test.  Sexually transmitted disease (STD) testing.  Diabetes screening. This is done by checking your blood sugar (glucose) after you have not eaten for a while (fasting). You may have this done every 1-3 years.  Mammogram. This may be done every 1-2 years. Talk to your health care provider  about when you should start having regular mammograms. This may depend on whether you have a family history of breast cancer.  BRCA-related cancer screening. This may be done if you have a family history of breast, ovarian, tubal, or peritoneal cancers.  Pelvic exam and Pap test. This may be done every 3 years starting at age 3. Starting at age 59, this may be done every 5 years if you have a Pap test in combination with an HPV test.  Bone density scan. This is done to screen for osteoporosis. You may have this scan if you are at high risk for osteoporosis. Discuss your test results, treatment options, and if necessary, the need for more tests with your health care provider. Vaccines Your health care provider may recommend certain vaccines, such as:  Influenza vaccine. This is recommended every year.  Tetanus, diphtheria, and acellular pertussis (Tdap, Td) vaccine. You may need a Td booster every 10 years.  Varicella vaccine. You may need this if you have not been vaccinated.  Zoster vaccine. You may need this after age 30.  Measles, mumps, and rubella (MMR) vaccine. You may need at least one dose of MMR if you were born in 1957 or later. You may also need a second dose.  Pneumococcal 13-valent conjugate (PCV13) vaccine. You may need this if you have certain conditions and were not previously vaccinated.  Pneumococcal polysaccharide (PPSV23) vaccine. You may need one or two doses if you smoke cigarettes or if you have certain conditions.  Meningococcal vaccine. You may need this if you have certain conditions.  Hepatitis A vaccine. You may need this if you have certain conditions or if you travel or work in places where you may be exposed to hepatitis A.  Hepatitis B vaccine. You may need this if you have certain conditions or if you travel or work in places where you may be exposed to hepatitis B.  Haemophilus influenzae type b (Hib) vaccine. You may need this if you have certain  conditions. Talk to your health care provider about which screenings and vaccines you need and how often you need them. This information is not intended to replace advice given to you by your health care provider. Make sure you discuss any questions you have with your health care provider. Document Released: 02/05/2015 Document Revised: 03/01/2017 Document Reviewed: 11/10/2014 Elsevier Interactive Patient Education  2019 [place], [place] [facility]

## 2018-06-27 ENCOUNTER — Other Ambulatory Visit: Payer: Self-pay | Admitting: Internal Medicine

## 2018-06-27 DIAGNOSIS — E538 Deficiency of other specified B group vitamins: Secondary | ICD-10-CM

## 2018-06-28 ENCOUNTER — Encounter: Payer: Self-pay | Admitting: Internal Medicine

## 2018-07-01 ENCOUNTER — Ambulatory Visit: Payer: BC Managed Care – PPO

## 2018-07-04 ENCOUNTER — Ambulatory Visit: Payer: BC Managed Care – PPO

## 2018-07-04 ENCOUNTER — Other Ambulatory Visit: Payer: Self-pay

## 2018-07-04 DIAGNOSIS — E538 Deficiency of other specified B group vitamins: Secondary | ICD-10-CM

## 2018-07-08 LAB — METHYLMALONIC ACID, SERUM: Methylmalonic Acid, Quant: 244 nmol/L (ref 87–318)

## 2018-07-31 ENCOUNTER — Ambulatory Visit
Admission: RE | Admit: 2018-07-31 | Discharge: 2018-07-31 | Disposition: A | Discharge status: Home / Self Care | Payer: BC Managed Care – PPO | Source: Ambulatory Visit | Attending: Unknown Physician Specialty | Admitting: Unknown Physician Specialty

## 2018-07-31 ENCOUNTER — Other Ambulatory Visit: Payer: Self-pay | Admitting: Internal Medicine

## 2018-07-31 DIAGNOSIS — Z1231 Encounter for screening mammogram for malignant neoplasm of breast: Secondary | ICD-10-CM

## 2018-10-02 ENCOUNTER — Ambulatory Visit
Admission: RE | Admit: 2018-10-02 | Discharge: 2018-10-02 | Disposition: A | Discharge status: Home / Self Care | Payer: BC Managed Care – PPO | Source: Ambulatory Visit | Attending: Internal Medicine | Admitting: Internal Medicine

## 2018-10-02 ENCOUNTER — Other Ambulatory Visit: Payer: Self-pay

## 2018-10-02 DIAGNOSIS — Z13820 Encounter for screening for osteoporosis: Secondary | ICD-10-CM

## 2018-10-04 LAB — HM DEXA SCAN

## 2018-10-08 ENCOUNTER — Encounter: Payer: Self-pay | Admitting: Internal Medicine

## 2018-10-14 ENCOUNTER — Encounter: Payer: Self-pay | Admitting: Internal Medicine

## 2018-11-20 ENCOUNTER — Encounter: Payer: Self-pay | Admitting: Internal Medicine

## 2018-12-09 ENCOUNTER — Encounter (INDEPENDENT_AMBULATORY_CARE_PROVIDER_SITE_OTHER): Payer: Self-pay | Admitting: Internal Medicine

## 2018-12-09 ENCOUNTER — Other Ambulatory Visit: Payer: Self-pay

## 2018-12-09 ENCOUNTER — Ambulatory Visit (INDEPENDENT_AMBULATORY_CARE_PROVIDER_SITE_OTHER): Payer: BC Managed Care – PPO | Admitting: Internal Medicine

## 2018-12-09 DIAGNOSIS — K588 Other irritable bowel syndrome: Secondary | ICD-10-CM | POA: Diagnosis not present

## 2018-12-09 DIAGNOSIS — K769 Liver disease, unspecified: Secondary | ICD-10-CM | POA: Diagnosis not present

## 2018-12-09 DIAGNOSIS — K589 Irritable bowel syndrome without diarrhea: Secondary | ICD-10-CM | POA: Insufficient documentation

## 2018-12-09 DIAGNOSIS — Z86010 Personal history of colonic polyps: Secondary | ICD-10-CM | POA: Diagnosis not present

## 2018-12-09 MED ORDER — DICYCLOMINE HCL 10 MG PO CAPS: 10.0000 mg | ORAL_CAPSULE | Freq: Two times a day (BID) | ORAL | 2 refills | Status: DC | PRN

## 2018-12-09 NOTE — Patient Instructions (Signed)
Colonoscopy to be scheduled in [id number]. Physician will call with results of CT when complted.
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
## 2018-12-13 ENCOUNTER — Other Ambulatory Visit (INDEPENDENT_AMBULATORY_CARE_PROVIDER_SITE_OTHER): Payer: Self-pay | Admitting: *Deleted

## 2018-12-13 DIAGNOSIS — K769 Liver disease, unspecified: Secondary | ICD-10-CM

## 2018-12-16 ENCOUNTER — Other Ambulatory Visit (INDEPENDENT_AMBULATORY_CARE_PROVIDER_SITE_OTHER): Payer: Self-pay | Admitting: *Deleted

## 2018-12-16 DIAGNOSIS — I669 Occlusion and stenosis of unspecified cerebral artery: Secondary | ICD-10-CM

## 2018-12-16 DIAGNOSIS — K769 Liver disease, unspecified: Secondary | ICD-10-CM

## 2018-12-17 ENCOUNTER — Ambulatory Visit (INDEPENDENT_AMBULATORY_CARE_PROVIDER_SITE_OTHER): Payer: BC Managed Care – PPO | Admitting: Internal Medicine

## 2018-12-23 ENCOUNTER — Other Ambulatory Visit (INDEPENDENT_AMBULATORY_CARE_PROVIDER_SITE_OTHER): Payer: Self-pay | Admitting: *Deleted

## 2018-12-23 DIAGNOSIS — K769 Liver disease, unspecified: Secondary | ICD-10-CM

## 2018-12-24 LAB — CREATININE, SERUM: Creat: 0.9 mg/dL (ref 0.50–0.99)

## 2019-01-01 ENCOUNTER — Other Ambulatory Visit: Payer: Self-pay

## 2019-01-01 ENCOUNTER — Ambulatory Visit (HOSPITAL_COMMUNITY)
Admission: RE | Admit: 2019-01-01 | Discharge: 2019-01-01 | Disposition: A | Discharge status: Home / Self Care | Payer: BC Managed Care – PPO | Source: Ambulatory Visit | Attending: Internal Medicine | Admitting: Internal Medicine

## 2019-01-01 DIAGNOSIS — K769 Liver disease, unspecified: Secondary | ICD-10-CM | POA: Insufficient documentation

## 2019-01-01 MED ORDER — IOHEXOL 300 MG/ML  SOLN
100.0000 mL | Freq: Once | INTRAMUSCULAR | Status: AC | PRN
  Administered 2019-01-01: 100 mL via INTRAVENOUS

## 2019-01-28 ENCOUNTER — Telehealth (INDEPENDENT_AMBULATORY_CARE_PROVIDER_SITE_OTHER): Payer: Self-pay | Admitting: *Deleted

## 2019-01-28 ENCOUNTER — Encounter (INDEPENDENT_AMBULATORY_CARE_PROVIDER_SITE_OTHER): Payer: Self-pay

## 2019-01-28 NOTE — Telephone Encounter (Signed)
You mentioned that you would discuss latest CT results with the radiologist at [facility] to make sure no further follow up warranted.  Since this was over the holidays, I wanted to touch base and see if you have had a chance to have that conversation and review the CT scan.   Thanks so much.  Danielle Gutierrez

## 2019-01-28 NOTE — Telephone Encounter (Signed)
CT images reviewed with Dr. Thornton Papas.  He noted kidney lesions have not changed in the last 12 months and was likely abscess.  No follow-up recommended. I went over recommendations with Tammy.

## 2019-03-03 ENCOUNTER — Other Ambulatory Visit (INDEPENDENT_AMBULATORY_CARE_PROVIDER_SITE_OTHER): Payer: Self-pay | Admitting: Internal Medicine

## 2019-05-12 ENCOUNTER — Encounter (INDEPENDENT_AMBULATORY_CARE_PROVIDER_SITE_OTHER): Payer: Self-pay

## 2019-05-15 ENCOUNTER — Encounter (INDEPENDENT_AMBULATORY_CARE_PROVIDER_SITE_OTHER): Payer: Self-pay | Admitting: *Deleted

## 2019-05-15 ENCOUNTER — Other Ambulatory Visit (INDEPENDENT_AMBULATORY_CARE_PROVIDER_SITE_OTHER): Payer: Self-pay | Admitting: *Deleted

## 2019-05-16 ENCOUNTER — Telehealth (INDEPENDENT_AMBULATORY_CARE_PROVIDER_SITE_OTHER): Payer: Self-pay | Admitting: *Deleted

## 2019-05-16 ENCOUNTER — Other Ambulatory Visit (INDEPENDENT_AMBULATORY_CARE_PROVIDER_SITE_OTHER): Payer: Self-pay | Admitting: *Deleted

## 2019-05-16 DIAGNOSIS — Z86010 Personal history of colonic polyps: Secondary | ICD-10-CM

## 2019-05-16 DIAGNOSIS — Z800 Family history of malignant neoplasm of digestive organs: Secondary | ICD-10-CM

## 2019-05-16 MED ORDER — SUTAB 1479-225-188 MG PO TABS: 1.0000 | ORAL_TABLET | Freq: Once | ORAL | 0 refills | Status: AC

## 2019-05-16 NOTE — Telephone Encounter (Signed)
Patient needs Sutab (copay card) ° °

## 2019-05-16 NOTE — Telephone Encounter (Signed)
Referring MD/PCP: hernandez-acosta   Procedure: tcs  Reason/Indication:  Hx polyps, fam hx colon ca  Has patient had this procedure before?  Yes, 2016  If so, when, by whom and where?    Is there a family history of colon cancer?  Yes, paternal aunt  Who?  What age when diagnosed?    Is patient diabetic?   no      Does patient have prosthetic heart valve or mechanical valve?  no  Do you have a pacemaker/defibrillator?  no  Has patient ever had endocarditis/atrial fibrillation? no  Does patient use oxygen? no  Has patient had joint replacement within last 12 months?  no  Is patient constipated or do they take laxatives? no  Does patient have a history of alcohol/drug use?  no  Is patient on blood thinner such as Coumadin, Plavix and/or Aspirin? no  Medications: none  Allergies: pcn  Medication Adjustment per Dr Laural Golden:   Procedure date & time: 06/12/19

## 2019-05-19 ENCOUNTER — Encounter (INDEPENDENT_AMBULATORY_CARE_PROVIDER_SITE_OTHER): Payer: Self-pay

## 2019-05-23 NOTE — Telephone Encounter (Signed)
Ok to schedule.

## 2019-05-27 ENCOUNTER — Encounter (INDEPENDENT_AMBULATORY_CARE_PROVIDER_SITE_OTHER): Payer: Self-pay

## 2019-06-10 ENCOUNTER — Other Ambulatory Visit (HOSPITAL_COMMUNITY)
Admission: RE | Admit: 2019-06-10 | Discharge: 2019-06-10 | Disposition: A | Discharge status: Home / Self Care | Payer: BC Managed Care – PPO | Source: Ambulatory Visit | Attending: Internal Medicine | Admitting: Internal Medicine

## 2019-06-10 ENCOUNTER — Other Ambulatory Visit (HOSPITAL_COMMUNITY): Payer: BC Managed Care – PPO

## 2019-06-10 ENCOUNTER — Other Ambulatory Visit: Payer: Self-pay

## 2019-06-10 DIAGNOSIS — Z20822 Contact with and (suspected) exposure to covid-19: Secondary | ICD-10-CM | POA: Insufficient documentation

## 2019-06-10 DIAGNOSIS — Z01812 Encounter for preprocedural laboratory examination: Secondary | ICD-10-CM | POA: Insufficient documentation

## 2019-06-11 LAB — SARS CORONAVIRUS 2 (TAT 6-24 HRS): SARS Coronavirus 2: NEGATIVE

## 2019-06-12 ENCOUNTER — Encounter (HOSPITAL_COMMUNITY)
Admission: RE | Disposition: A | Discharge status: Home / Self Care | Payer: Self-pay | Source: Home / Self Care | Attending: Internal Medicine

## 2019-06-12 ENCOUNTER — Other Ambulatory Visit: Payer: Self-pay

## 2019-06-12 ENCOUNTER — Ambulatory Visit (HOSPITAL_COMMUNITY)
Admission: RE | Admit: 2019-06-12 | Discharge: 2019-06-12 | Disposition: A | Discharge status: Home / Self Care | Payer: BC Managed Care – PPO | Attending: Internal Medicine | Admitting: Internal Medicine

## 2019-06-12 ENCOUNTER — Encounter (HOSPITAL_COMMUNITY): Payer: Self-pay | Admitting: Internal Medicine

## 2019-06-12 DIAGNOSIS — Z880 Allergy status to penicillin: Secondary | ICD-10-CM | POA: Insufficient documentation

## 2019-06-12 DIAGNOSIS — K648 Other hemorrhoids: Secondary | ICD-10-CM | POA: Insufficient documentation

## 2019-06-12 DIAGNOSIS — Z1211 Encounter for screening for malignant neoplasm of colon: Secondary | ICD-10-CM | POA: Insufficient documentation

## 2019-06-12 DIAGNOSIS — K6289 Other specified diseases of anus and rectum: Secondary | ICD-10-CM | POA: Diagnosis not present

## 2019-06-12 DIAGNOSIS — K644 Residual hemorrhoidal skin tags: Secondary | ICD-10-CM

## 2019-06-12 DIAGNOSIS — Z800 Family history of malignant neoplasm of digestive organs: Secondary | ICD-10-CM | POA: Diagnosis not present

## 2019-06-12 DIAGNOSIS — Z86010 Personal history of colonic polyps: Secondary | ICD-10-CM | POA: Diagnosis not present

## 2019-06-12 DIAGNOSIS — K5730 Diverticulosis of large intestine without perforation or abscess without bleeding: Secondary | ICD-10-CM | POA: Insufficient documentation

## 2019-06-12 DIAGNOSIS — D120 Benign neoplasm of cecum: Secondary | ICD-10-CM | POA: Diagnosis not present

## 2019-06-12 HISTORY — PX: COLONOSCOPY: SHX5424

## 2019-06-12 HISTORY — PX: POLYPECTOMY: SHX5525

## 2019-06-12 SURGERY — COLONOSCOPY
Anesthesia: Moderate Sedation

## 2019-06-12 MED ORDER — MIDAZOLAM HCL 5 MG/5ML IJ SOLN
INTRAMUSCULAR | Status: DC | PRN
  Administered 2019-06-12: 1 mg via INTRAVENOUS
  Administered 2019-06-12 (×2): 2 mg via INTRAVENOUS
  Administered 2019-06-12: 1 mg via INTRAVENOUS

## 2019-06-12 MED ORDER — MEPERIDINE HCL 50 MG/ML IJ SOLN
INTRAMUSCULAR | Status: DC | PRN
  Administered 2019-06-12 (×2): 25 mg via INTRAVENOUS

## 2019-06-12 MED ORDER — MIDAZOLAM HCL 5 MG/5ML IJ SOLN
INTRAMUSCULAR | Status: AC
  Filled 2019-06-12: qty 10

## 2019-06-12 MED ORDER — MEPERIDINE HCL 50 MG/ML IJ SOLN
INTRAMUSCULAR | Status: AC
  Filled 2019-06-12: qty 1

## 2019-06-12 MED ORDER — SODIUM CHLORIDE 0.9 % IV SOLN: INTRAVENOUS | Status: DC

## 2019-06-12 MED ORDER — STERILE WATER FOR IRRIGATION IR SOLN
Status: DC | PRN
  Administered 2019-06-12: 1.5 mL

## 2019-06-12 NOTE — Op Note (Signed)
[facility] Patient Name: Danielle Gutierrez Procedure Date: 06/12/2019 11:26 AM MRN: [id number] Date of Birth: [birth year] Attending MD: Hildred Laser , MD CSN: [id number] Age: 59 Admit Type: Outpatient Procedure:                Colonoscopy Indications:              High risk colon cancer surveillance: Personal                            history of colonic polyps Providers:                Hildred Laser, MD, Otis Peak B. Sharon Seller, RN, Randa Spike, Technician Referring MD:             Rayford Halsted. Isaac Bliss, MD Medicines:                Meperidine 50 mg IV, Midazolam 6 mg IV Complications:            No immediate complications. Estimated Blood Loss:     Estimated blood loss: none. Estimated blood loss                            was minimal. Procedure:                Pre-Anesthesia Assessment:                           - Prior to the procedure, a History and Physical                            was performed, and patient medications and                            allergies were reviewed. The patient's tolerance of                            previous anesthesia was also reviewed. The risks                            and benefits of the procedure and the sedation                            options and risks were discussed with the patient.                            All questions were answered, and informed consent                            was obtained. Prior Anticoagulants: The patient has                            taken no previous anticoagulant or antiplatelet  agents. ASA Grade Assessment: I - A normal, healthy                            patient. After reviewing the risks and benefits,                            the patient was deemed in satisfactory condition to                            undergo the procedure.                           After obtaining informed consent, the colonoscope                            was passed  under direct vision. Throughout the                            procedure, the patient's blood pressure, pulse, and                            oxygen saturations were monitored continuously. The                            [id number]) was introduced through the                            anus and advanced to the the cecum, identified by                            appendiceal orifice and ileocecal valve. The                            colonoscopy was performed without difficulty. The                            patient tolerated the procedure well. The quality                            of the bowel preparation was good. The ileocecal                            valve, appendiceal orifice, and rectum were                            photographed. Scope In: 11:49:58 AM Scope Out: 12:08:55 PM Scope Withdrawal Time: 0 hours 10 minutes 4 seconds  Total Procedure Duration: 0 hours 18 minutes 57 seconds  Findings:      The perianal and digital rectal examinations were normal.      A small polyp was found in the cecum. Biopsies were taken with a cold       forceps for histology.      A few small-mouthed diverticula were found in the sigmoid colon.      Internal and external  hemorrhoids were found during retroflexion. The       hemorrhoids were small.      Anal papilla(e) were hypertrophied. Impression:               - One small polyp in the cecum. Biopsied.                           - Diverticulosis in the sigmoid colon.                           - Internal and external hemorrhoids.                           - Anal papilla(e) were hypertrophied. Moderate Sedation:      Moderate (conscious) sedation was administered by the endoscopy nurse       and supervised by the endoscopist. The following parameters were       monitored: oxygen saturation, heart rate, blood pressure, CO2       capnography and response to care. Total physician intraservice time was       23 minutes. Recommendation:            - Patient has a contact number available for                            emergencies. The signs and symptoms of potential                            delayed complications were discussed with the                            patient. Return to normal activities tomorrow.                            Written discharge instructions were provided to the                            patient.                           - High fiber diet today.                           - Continue present medications.                           - No aspirin, ibuprofen, naproxen, or other                            non-steroidal anti-inflammatory drugs for 1 day.                           - Await pathology results.                           - Repeat colonoscopy is recommended. The  colonoscopy date will be determined after pathology                            results from today's exam become available for                            review. Procedure Code(s):        --- Professional ---                           [phone], Colonoscopy, flexible; with biopsy, single                            or multiple                           99153, Moderate sedation; each additional 15                            minutes intraservice time                           G0500, Moderate sedation services provided by the                            same physician or other qualified health care                            professional performing a gastrointestinal                            endoscopic service that sedation supports,                            requiring the presence of an independent trained                            observer to assist in the monitoring of the                            patient's level of consciousness and physiological                            status; initial 15 minutes of intra-service time;                            patient age 44 years or older (additional time may                             be reported with [phone], as appropriate) Diagnosis Code(s):        --- Professional ---                           Z86.010, Personal history of colonic polyps  K63.5, Polyp of colon                           K64.8, Other hemorrhoids                           K62.89, Other specified diseases of anus and rectum                           K57.30, Diverticulosis of large intestine without                            perforation or abscess without bleeding CPT copyright 2019 American [employer]. All rights reserved. The codes documented in this report are preliminary and upon coder review may  be revised to meet current compliance requirements. Hildred Laser, MD Hildred Laser, MD 06/12/2019 12:16:55 PM This report has been signed electronically. Number of Addenda: 0

## 2019-06-12 NOTE — H&P (Signed)
Danielle Gutierrez is an 59 y.o. female.   Chief Complaint: Patient is here for colonoscopy. HPI: Patient 59 year old Caucasian female who has history of colonic adenoma and family history of CRC no second-degree relative who is here for surveillance colonoscopy.  Last exam was in May 2016 [facility] by Dr. Laurence Spates of [facility]. Patient denies abdominal pain change in bowel habits or rectal bleeding. Family history significant for CRC in her maternal aunt was possibly in her late 42 and died within couple of years of metastatic disease.  Past Medical History:  Diagnosis Date  . Serous cystadenoma of ovary     Past Surgical History:  Procedure Laterality Date  . ABDOMINAL HYSTERECTOMY    . CESAREAN SECTION  1985  . COLONOSCOPY  05/2014   [facility]  . Uterine ablasion  2009  2009    Family History  Adopted: Yes  Problem Relation Age of Onset  . Breast cancer Maternal Aunt 75  . Arthritis Mother   . Peripheral vascular disease Mother    Social History:  reports that she has never smoked. She has never used smokeless tobacco. She reports current alcohol use. She reports that she does not use drugs.  Allergies:  Allergies  Allergen Reactions  . Penicillins Rash    Medications Prior to Admission  Medication Sig Dispense Refill  . levocetirizine (XYZAL) 5 MG tablet Take 5 mg by mouth daily as needed for allergies.    Marland Kitchen dicyclomine (BENTYL) 10 MG capsule TAKE 1 CAPSULE (10 MG TOTAL) BY MOUTH 2 (TWO) TIMES DAILY AS NEEDED FOR SPASMS. 180 capsule 0    Results for orders placed or performed during the hospital encounter of 06/10/19 (from the past 48 hour(s))  SARS CORONAVIRUS 2 (TAT 6-24 HRS) Nasopharyngeal Nasopharyngeal Swab     Status: None   Collection Time: 06/10/19  3:15 PM   Specimen: Nasopharyngeal Swab  Result Value Ref Range   SARS Coronavirus 2 NEGATIVE NEGATIVE    Comment: (NOTE) SARS-CoV-2 target nucleic acids are NOT DETECTED. The SARS-CoV-2 RNA is  generally detectable in upper and lower respiratory specimens during the acute phase of infection. Negative results do not preclude SARS-CoV-2 infection, do not rule out co-infections with other pathogens, and should not be used as the sole basis for treatment or other patient management decisions. Negative results must be combined with clinical observations, patient history, and epidemiological information. The expected result is Negative. Fact Sheet for Patients: SugarRoll.be Fact Sheet for Healthcare Providers: https://www.woods-mathews.com/ This test is not yet approved or cleared by the [place] FDA and  has been authorized for detection and/or diagnosis of SARS-CoV-2 by FDA under an Emergency Use Authorization (EUA). This EUA will remain  in effect (meaning this test can be used) for the duration of the COVID-19 declaration under Section 56 4(b)(1) of the Act, 21 U.S.C. section 360bbb-3(b)(1), unless the authorization is terminated or revoked sooner. Performed at [facility], [place] [address], [place], [place] [address]    No results found.  Review of Systems  Blood pressure 108/77, pulse (!) 57, temperature 98.2 F (36.8 C), temperature source Oral, resp. rate 14, height 5\' 5"  (1.651 m), weight 65.8 kg, SpO2 100 %. Physical Exam  Constitutional: She appears well-developed and well-nourished.  HENT:  Mouth/Throat: Oropharynx is clear and moist.  Eyes: Conjunctivae are normal. No scleral icterus.  Neck: No thyromegaly present.  Cardiovascular: Normal rate, regular rhythm and normal heart sounds.  No murmur heard. Respiratory: Effort normal and  breath sounds normal.  GI:  Abdomen is symmetrical.  There is lower midline scar.  Abdomen is soft and nontender with organomegaly or masses.  Musculoskeletal:        General: No edema.  Lymphadenopathy:    She has no cervical adenopathy.  Neurological: She is alert.  Skin: Skin  is warm and dry.     Assessment/Plan history of colonic adenoma. Family history of CRC and second-degree relative. Surveillance colonoscopy.  Hildred Laser, MD 06/12/2019, 11:40 AM

## 2019-06-12 NOTE — Discharge Instructions (Signed)
No aspirin or NSAIDs for 24 hours. Resume usual medications as before. High-fiber diet. No driving for 24 hours. Physician will call with biopsy results.  PATIENT INSTRUCTIONS POST-ANESTHESIA  IMMEDIATELY FOLLOWING SURGERY:  Do not drive or operate machinery for the first twenty four hours after surgery.  Do not make any important decisions for twenty four hours after surgery or while taking narcotic pain medications or sedatives.  If you develop intractable nausea and vomiting or a severe headache please notify your doctor immediately.  FOLLOW-UP:  Please make an appointment with your surgeon as instructed. You do not need to follow up with anesthesia unless specifically instructed to do so.  WOUND CARE INSTRUCTIONS (if applicable):  Keep a dry clean dressing on the anesthesia/puncture wound site if there is drainage.  Once the wound has quit draining you may leave it open to air.  Generally you should leave the bandage intact for twenty four hours unless there is drainage.  If the epidural site drains for more than 36-48 hours please call the anesthesia department.  QUESTIONS?:  Please feel free to call your physician or the hospital operator if you have any questions, and they will be happy to assist you.      Colonoscopy, Adult, Care After This sheet gives you information about how to care for yourself after your procedure. Your doctor may also give you more specific instructions. If you have problems or questions, call your doctor. What can I expect after the procedure? After the procedure, it is common to have:  A small amount of blood in your poop (stool) for 24 hours.  Some gas.  Mild cramping or bloating in your belly (abdomen). Follow these instructions at home: Eating and drinking   Drink enough fluid to keep your pee (urine) pale yellow.  Follow instructions from your doctor about what you cannot eat or drink.  Return to your normal diet as told by your doctor. Avoid  heavy or fried foods that are hard to digest. Activity  Rest as told by your doctor.  Do not sit for a long time without moving. Get up to take short walks every 1-2 hours. This is important. Ask for help if you feel weak or unsteady.  Return to your normal activities as told by your doctor. Ask your doctor what activities are safe for you. To help cramping and bloating:   Try walking around.  Put heat on your belly as told by your doctor. Use the heat source that your doctor recommends, such as a moist heat pack or a heating pad. ? Put a towel between your skin and the heat source. ? Leave the heat on for 20-30 minutes. ? Remove the heat if your skin turns bright red. This is very important if you are unable to feel pain, heat, or cold. You may have a greater risk of getting burned. General instructions  For the first 24 hours after the procedure: ? Do not drive or use machinery. ? Do not sign important documents. ? Do not drink alcohol. ? Do your daily activities more slowly than normal. ? Eat foods that are soft and easy to digest.  Take over-the-counter or prescription medicines only as told by your doctor.  Keep all follow-up visits as told by your doctor. This is important. Contact a doctor if:  You have blood in your poop 2-3 days after the procedure. Get help right away if:  You have more than a small amount of blood in your poop.  You see large clumps of tissue (blood clots) in your poop.  Your belly is swollen.  You feel like you may vomit (nauseous).  You vomit.  You have a fever.  You have belly pain that gets worse, and medicine does not help your pain. Summary  After the procedure, it is common to have a small amount of blood in your poop. You may also have mild cramping and bloating in your belly.  For the first 24 hours after the procedure, do not drive or use machinery, do not sign important documents, and do not drink alcohol.  Get help right  away if you have a lot of blood in your poop, feel like you may vomit, have a fever, or have more belly pain. This information is not intended to replace advice given to you by your health care provider. Make sure you discuss any questions you have with your health care provider. Document Revised: 08/05/2018 Document Reviewed: 08/05/2018 Elsevier Patient Education  [place].   Colon Polyps  Polyps are tissue growths inside the body. Polyps can grow in many places, including the large intestine (colon). A polyp may be a round bump or a mushroom-shaped growth. You could have one polyp or several. Most colon polyps are noncancerous (benign). However, some colon polyps can become cancerous over time. Finding and removing the polyps early can help prevent this. What are the causes? The exact cause of colon polyps is not known. What increases the risk? You are more likely to develop this condition if you:  Have a family history of colon cancer or colon polyps.  Are older than 55 or older than 45 if you are African American.  Have inflammatory bowel disease, such as ulcerative colitis or Crohn's disease.  Have certain hereditary conditions, such as: ? Familial adenomatous polyposis. ? Lynch syndrome. ? Turcot syndrome. ? Peutz-Jeghers syndrome.  Are overweight.  Smoke cigarettes.  Do not get enough exercise.  Drink too much alcohol.  Eat a diet that is high in fat and red meat and low in fiber.  Had childhood cancer that was treated with abdominal radiation. What are the signs or symptoms? Most polyps do not cause symptoms. If you have symptoms, they may include:  Blood coming from your rectum when having a bowel movement.  Blood in your stool. The stool may look dark red or black.  Abdominal pain.  A change in bowel habits, such as constipation or diarrhea. How is this diagnosed? This condition is diagnosed with a colonoscopy. This is a procedure in which a  lighted, flexible scope is inserted into the anus and then passed into the colon to examine the area. Polyps are sometimes found when a colonoscopy is done as part of routine cancer screening tests. How is this treated? Treatment for this condition involves removing any polyps that are found. Most polyps can be removed during a colonoscopy. Those polyps will then be tested for cancer. Additional treatment may be needed depending on the results of testing. Follow these instructions at home: Lifestyle  Maintain a healthy weight, or lose weight if recommended by your health care provider.  Exercise every day or as told by your health care provider.  Do not use any products that contain nicotine or tobacco, such as cigarettes and e-cigarettes. If you need help quitting, ask your health care provider.  If you drink alcohol, limit how much you have: ? 0-1 drink a day for women. ? 0-2 drinks a day for men.  Be aware of how much alcohol is in your drink. In the [place] one drink equals one 12 oz bottle of beer (355 mL), one 5 oz glass of wine (148 mL), or one 1 oz shot of hard liquor (44 mL). Eating and drinking   Eat foods that are high in fiber, such as fruits, vegetables, and whole grains.  Eat foods that are high in calcium and vitamin D, such as milk, cheese, yogurt, eggs, liver, fish, and broccoli.  Limit foods that are high in fat, such as fried foods and desserts.  Limit the amount of red meat and processed meat you eat, such as hot dogs, sausage, bacon, and lunch meats. General instructions  Keep all follow-up visits as told by your health care provider. This is important. ? This includes having regularly scheduled colonoscopies. ? Talk to your health care provider about when you need a colonoscopy. Contact a health care provider if:  You have new or worsening bleeding during a bowel movement.  You have new or increased blood in your stool.  You have a change in bowel  habits.  You lose weight for no known reason. Summary  Polyps are tissue growths inside the body. Polyps can grow in many places, including the colon.  Most colon polyps are noncancerous (benign), but some can become cancerous over time.  This condition is diagnosed with a colonoscopy.  Treatment for this condition involves removing any polyps that are found. Most polyps can be removed during a colonoscopy. This information is not intended to replace advice given to you by your health care provider. Make sure you discuss any questions you have with your health care provider. Document Revised: 04/26/2017 Document Reviewed: 04/26/2017 Elsevier Patient Education  [place].   Diverticulosis  Diverticulosis is a condition that develops when small pouches (diverticula) form in the wall of the large intestine (colon). The colon is where water is absorbed and stool (feces) is formed. The pouches form when the inside layer of the colon pushes through weak spots in the outer layers of the colon. You may have a few pouches or many of them. The pouches usually do not cause problems unless they become inflamed or infected. When this happens, the condition is called diverticulitis. What are the causes? The cause of this condition is not known. What increases the risk? The following factors may make you more likely to develop this condition:  Being older than age 37. Your risk for this condition increases with age. Diverticulosis is rare among people younger than age 30. By age 34, many people have it.  Eating a low-fiber diet.  Having frequent constipation.  Being overweight.  Not getting enough exercise.  Smoking.  Taking over-the-counter pain medicines, like aspirin and ibuprofen.  Having a family history of diverticulosis. What are the signs or symptoms? In most people, there are no symptoms of this condition. If you do have symptoms, they may include:  Bloating.  Cramps  in the abdomen.  Constipation or diarrhea.  Pain in the lower left side of the abdomen. How is this diagnosed? Because diverticulosis usually has no symptoms, it is most often diagnosed during an exam for other colon problems. The condition may be diagnosed by:  Using a flexible scope to examine the colon (colonoscopy).  Taking an X-ray of the colon after dye has been put into the colon (barium enema).  Having a CT scan. How is this treated? You may not need treatment for this condition. Your health  care provider may recommend treatment to prevent problems. You may need treatment if you have symptoms or if you previously had diverticulitis. Treatment may include:  Eating a high-fiber diet.  Taking a fiber supplement.  Taking a live bacteria supplement (probiotic).  Taking medicine to relax your colon. Follow these instructions at home: Medicines  Take over-the-counter and prescription medicines only as told by your health care provider.  If told by your health care provider, take a fiber supplement or probiotic. Constipation prevention Your condition may cause constipation. To prevent or treat constipation, you may need to:  Drink enough fluid to keep your urine pale yellow.  Take over-the-counter or prescription medicines.  Eat foods that are high in fiber, such as beans, whole grains, and fresh fruits and vegetables.  Limit foods that are high in fat and processed sugars, such as fried or sweet foods.  General instructions  Try not to strain when you have a bowel movement.  Keep all follow-up visits as told by your health care provider. This is important. Contact a health care provider if you:  Have pain in your abdomen.  Have bloating.  Have cramps.  Have not had a bowel movement in 3 days. Get help right away if:  Your pain gets worse.  Your bloating becomes very bad.  You have a fever or chills, and your symptoms suddenly get worse.  You  vomit.  You have bowel movements that are bloody or black.  You have bleeding from your rectum. Summary  Diverticulosis is a condition that develops when small pouches (diverticula) form in the wall of the large intestine (colon).  You may have a few pouches or many of them.  This condition is most often diagnosed during an exam for other colon problems.  Treatment may include increasing the fiber in your diet, taking supplements, or taking medicines. This information is not intended to replace advice given to you by your health care provider. Make sure you discuss any questions you have with your health care provider. Document Revised: 08/08/2018 Document Reviewed: 08/08/2018 Elsevier Patient Education  [place].   Hemorrhoids Hemorrhoids are swollen veins that may develop:  In the butt (rectum). These are called internal hemorrhoids.  Around the opening of the butt (anus). These are called external hemorrhoids. Hemorrhoids can cause pain, itching, or bleeding. Most of the time, they do not cause serious problems. They usually get better with diet changes, lifestyle changes, and other home treatments. What are the causes? This condition may be caused by:  Having trouble pooping (constipation).  Pushing hard (straining) to poop.  Watery poop (diarrhea).  Pregnancy.  Being very overweight (obese).  Sitting for long periods of time.  Heavy lifting or other activity that causes you to strain.  Anal sex.  Riding a bike for a long period of time. What are the signs or symptoms? Symptoms of this condition include:  Pain.  Itching or soreness in the butt.  Bleeding from the butt.  Leaking poop.  Swelling in the area.  One or more lumps around the opening of your butt. How is this diagnosed? A doctor can often diagnose this condition by looking at the affected area. The doctor may also:  Do an exam that involves feeling the area with a gloved hand  (digital rectal exam).  Examine the area inside your butt using a small tube (anoscope).  Order blood tests. This may be done if you have lost a lot of blood.  Have you  get a test that involves looking inside the colon using a flexible tube with a camera on the end (sigmoidoscopy or colonoscopy). How is this treated? This condition can usually be treated at home. Your doctor may tell you to change what you eat, make lifestyle changes, or try home treatments. If these do not help, procedures can be done to remove the hemorrhoids or make them smaller. These may involve:  Placing rubber bands at the base of the hemorrhoids to cut off their blood supply.  Injecting medicine into the hemorrhoids to shrink them.  Shining a type of light energy onto the hemorrhoids to cause them to fall off.  Doing surgery to remove the hemorrhoids or cut off their blood supply. Follow these instructions at home: Eating and drinking   Eat foods that have a lot of fiber in them. These include whole grains, beans, nuts, fruits, and vegetables.  Ask your doctor about taking products that have added fiber (fibersupplements).  Reduce the amount of fat in your diet. You can do this by: ? Eating low-fat dairy products. ? Eating less red meat. ? Avoiding processed foods.  Drink enough fluid to keep your pee (urine) pale yellow. Managing pain and swelling   Take a warm-water bath (sitz bath) for 20 minutes to ease pain. Do this 3-4 times a day. You may do this in a bathtub or using a portable sitz bath that fits over the toilet.  If told, put ice on the painful area. It may be helpful to use ice between your warm baths. ? Put ice in a plastic bag. ? Place a towel between your skin and the bag. ? Leave the ice on for 20 minutes, 2-3 times a day. General instructions  Take over-the-counter and prescription medicines only as told by your doctor. ? Medicated creams and medicines may be used as  told.  Exercise often. Ask your doctor how much and what kind of exercise is best for you.  Go to the bathroom when you have the urge to poop. Do not wait.  Avoid pushing too hard when you poop.  Keep your butt dry and clean. Use wet toilet paper or moist towelettes after pooping.  Do not sit on the toilet for a long time.  Keep all follow-up visits as told by your doctor. This is important. Contact a doctor if you:  Have pain and swelling that do not get better with treatment or medicine.  Have trouble pooping.  Cannot poop.  Have pain or swelling outside the area of the hemorrhoids. Get help right away if you have:  Bleeding that will not stop. Summary  Hemorrhoids are swollen veins in the butt or around the opening of the butt.  They can cause pain, itching, or bleeding.  Eat foods that have a lot of fiber in them. These include whole grains, beans, nuts, fruits, and vegetables.  Take a warm-water bath (sitz bath) for 20 minutes to ease pain. Do this 3-4 times a day. This information is not intended to replace advice given to you by your health care provider. Make sure you discuss any questions you have with your health care provider. Document Revised: 01/17/2018 Document Reviewed: 05/31/2017 Elsevier Patient Education  [place].

## 2019-06-13 LAB — SURGICAL PATHOLOGY

## 2019-07-30 ENCOUNTER — Other Ambulatory Visit: Payer: Self-pay | Admitting: Internal Medicine

## 2019-07-30 DIAGNOSIS — Z1231 Encounter for screening mammogram for malignant neoplasm of breast: Secondary | ICD-10-CM

## 2019-08-05 ENCOUNTER — Ambulatory Visit
Admission: RE | Admit: 2019-08-05 | Discharge: 2019-08-05 | Disposition: A | Discharge status: Home / Self Care | Payer: BC Managed Care – PPO | Source: Ambulatory Visit

## 2019-08-05 ENCOUNTER — Other Ambulatory Visit: Payer: Self-pay

## 2019-08-05 DIAGNOSIS — Z1231 Encounter for screening mammogram for malignant neoplasm of breast: Secondary | ICD-10-CM

## 2019-11-24 IMAGING — CT CT ABD-PELV W/ CM
2 of 5 series · 16 of 46 positions shown, 18 images · IV contrast (Isovue)
Comparison: Ultrasound 12/25/2017

CLINICAL DATA: Evaluate complex cystic mass

EXAM:
CT ABDOMEN AND PELVIS WITH CONTRAST
TECHNIQUE: Multidetector CT imaging of the abdomen and pelvis was performed
using the standard protocol following bolus administration of
intravenous contrast.
CONTRAST:  100mL XGNZP5-ZKK IOPAMIDOL (XGNZP5-ZKK) INJECTION 61%

[Series 2: axial st · axial · 0.63mm/px · z∈[-604,-218]mm · 13 of 87 slices shown, 15 images]
[im 5/87  soft-tissue]
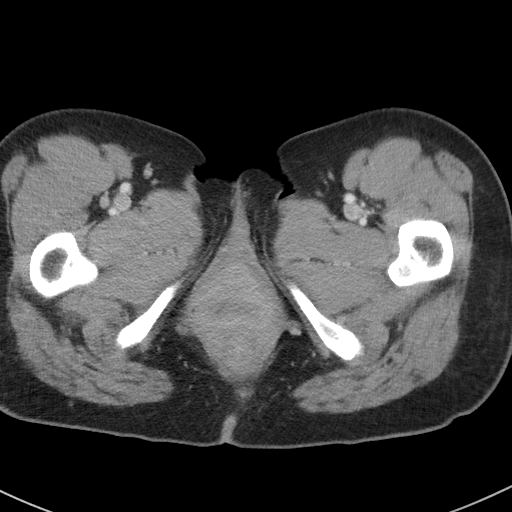
[im 5/87  bone]
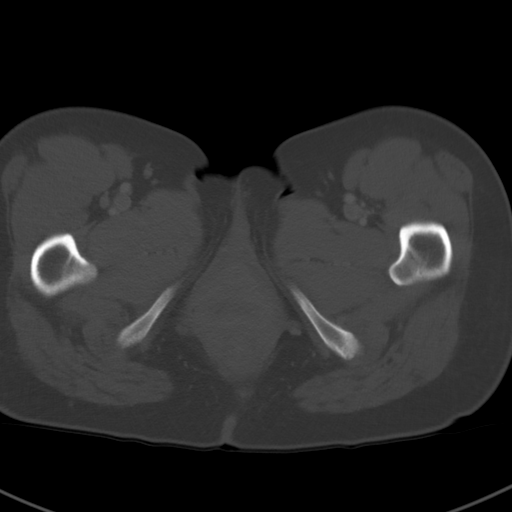
[im 14/87  soft-tissue]
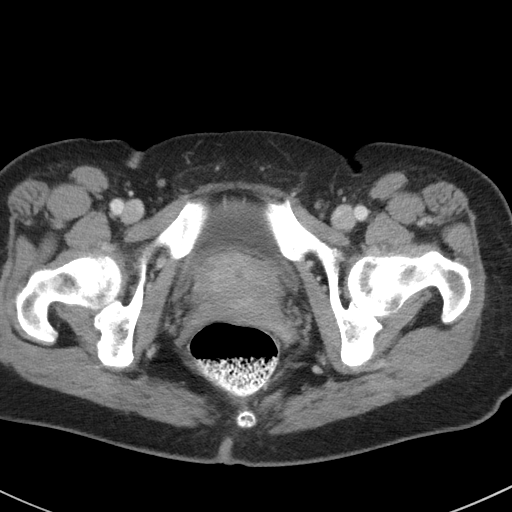
[im 19/87  soft-tissue]
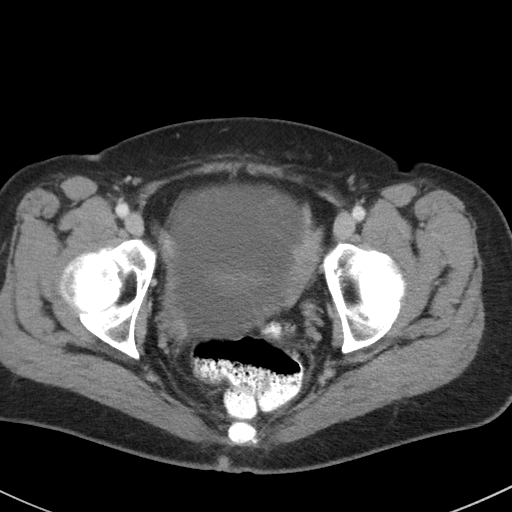
[im 23/87  soft-tissue]
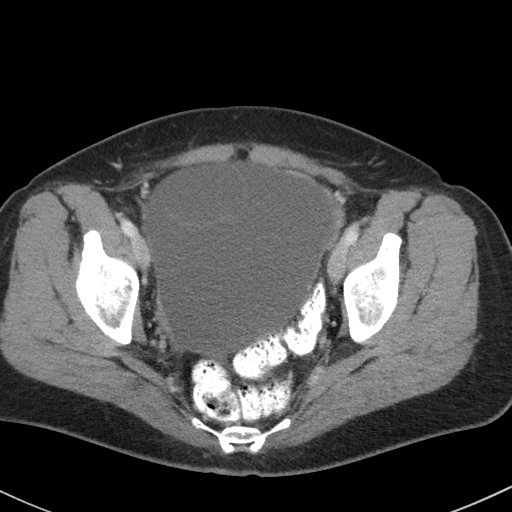
[im 32/87  soft-tissue]
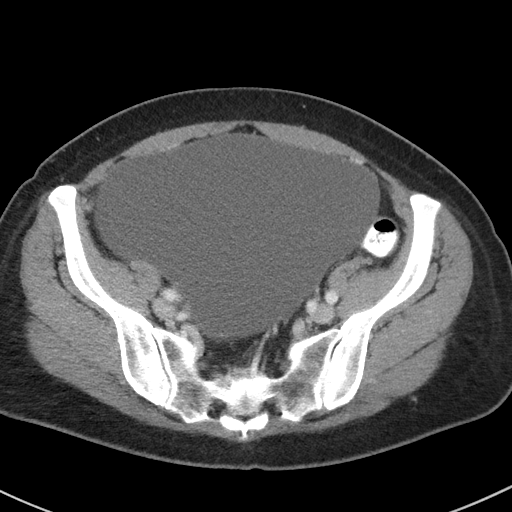
[im 37/87  soft-tissue]
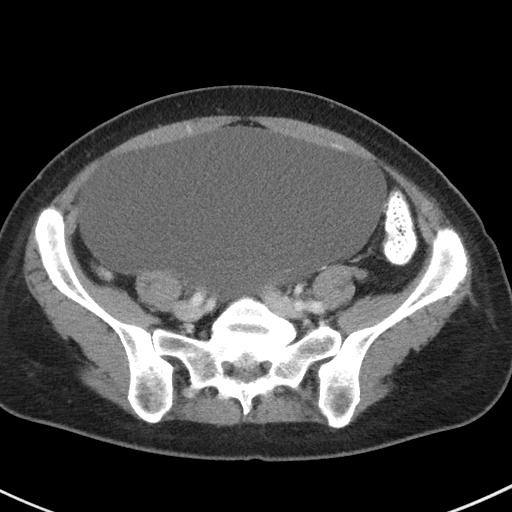
[im 46/87  soft-tissue]
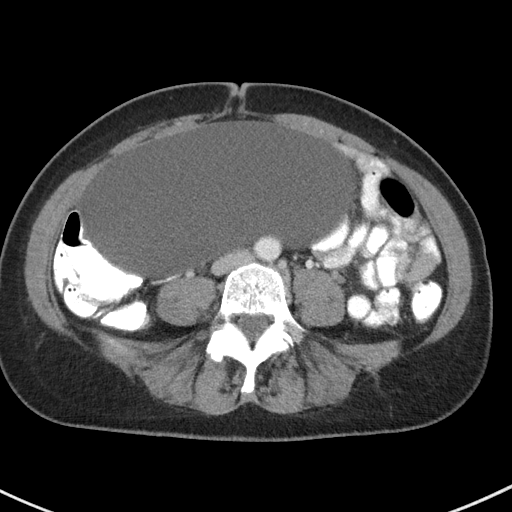
[im 50/87  soft-tissue]
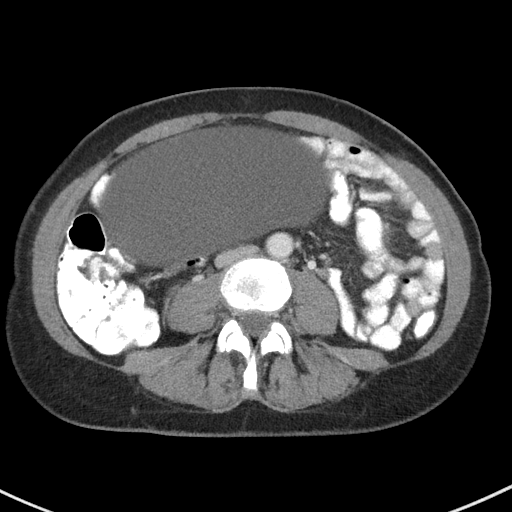
[im 55/87  soft-tissue]
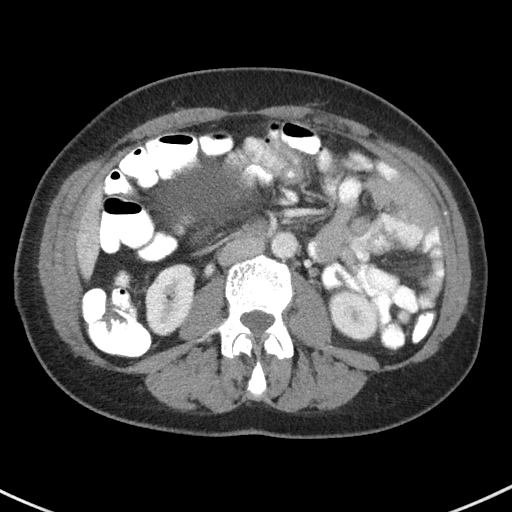
[im 55/87  bone]
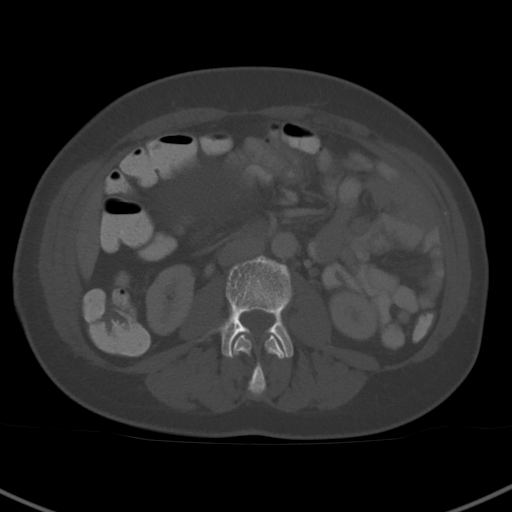
[im 64/87  soft-tissue]
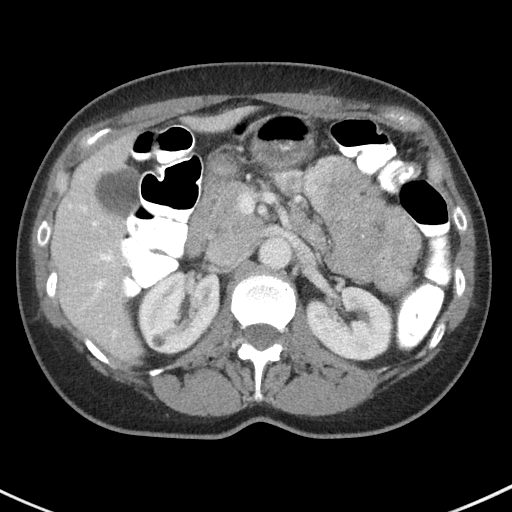
[im 68/87  soft-tissue]
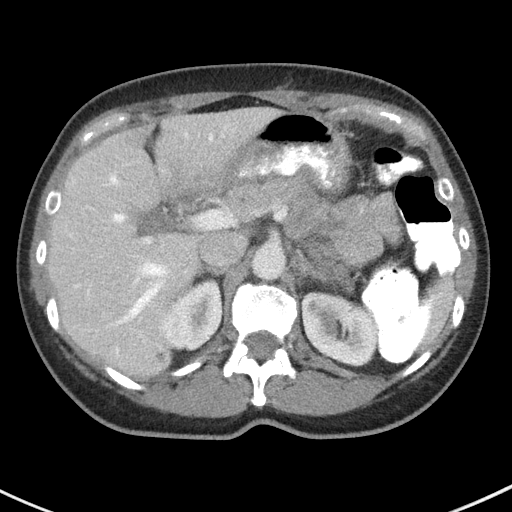
[im 73/87  soft-tissue]
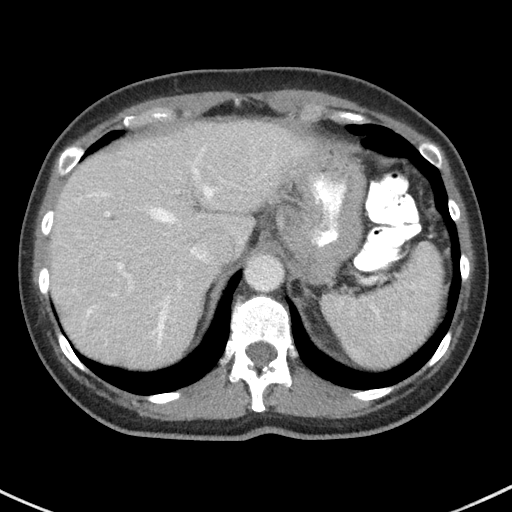
[im 82/87  soft-tissue]
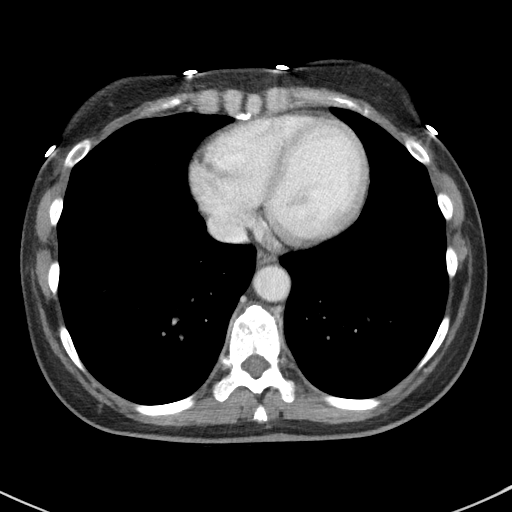

[Series 6: coronal st · coronal · 0.75mm/px · 3 of 87 slices shown]
[im 29/87  soft-tissue]
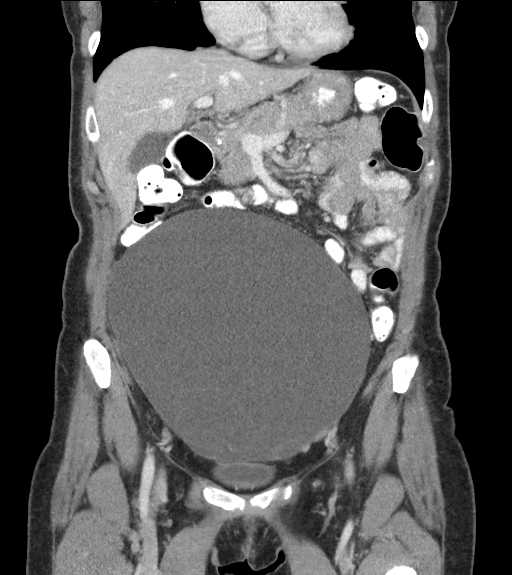
[im 39/87  soft-tissue]
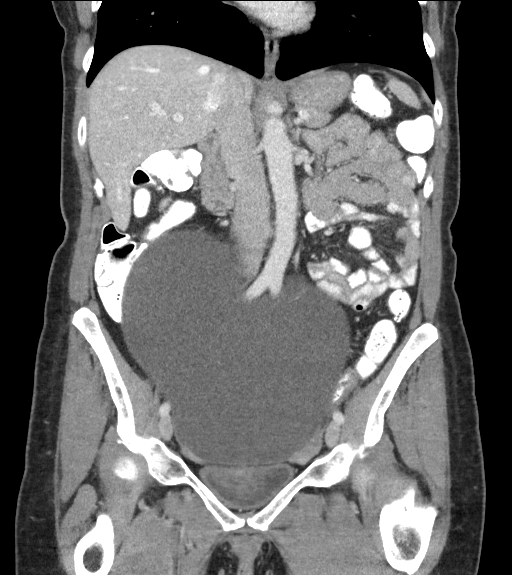
[im 48/87  soft-tissue]
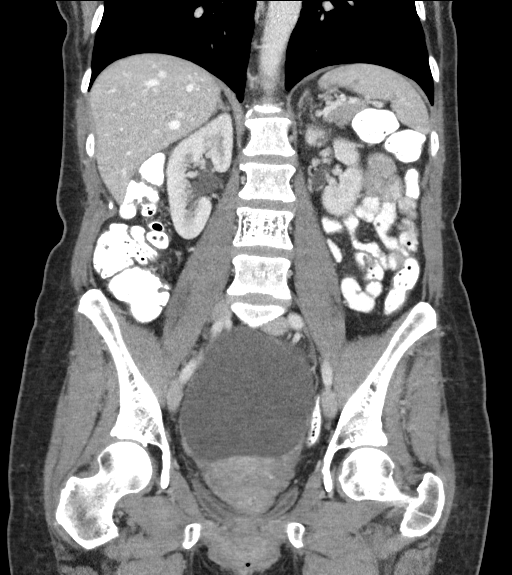

[16 of 46 positions shown; findings below may reference images not displayed]

FINDINGS: Lower chest: No acute abnormality.

Hepatobiliary: Low-density structure within left lobe of liver
measures 7 mm and is too small to reliably characterize. Within the
posterior right lobe of liver there is a 6 mm low-density structure.
Also too small to characterize.Gallbladder appears normal. No
biliary ductal dilatation.

Pancreas: Unremarkable. No pancreatic ductal dilatation or
surrounding inflammatory changes.

Spleen: Normal in size without focal abnormality.

Adrenals/Urinary Tract: Normal appearance of the adrenal glands.
Low-attenuation structure within right lobe of liver measures 8 mm.
Too small to characterize. Additional small low-density foci within
the left kidney are too small to reliably characterize. No solid
enhancing mass or hydronephrosis identified. Urinary bladder appears
normal for degree of distention.

Stomach/Bowel: Stomach normal. The small bowel loops have a normal
course and caliber without obstruction. The appendix is visualized
and appears normal. Unremarkable appearance of the colon.

Vascular/Lymphatic: Normal appearance of the abdominal aorta. No
enlarged retroperitoneal or mesenteric adenopathy. No enlarged
pelvic or inguinal lymph nodes.

Reproductive: There is a large cystic mass within the pelvis and
lower abdomen measuring 12.7 x 17.7 by 20.4 cm (volume = 2777
cm^3). Several areas of thin internal septation are identified. No
solid enhancing mural nodularity identified. The uterus is
unremarkable. The ovaries are not confidently identified.

Other: No ascites identified.  No peritoneal nodularity.

Musculoskeletal: No acute or significant osseous findings.
IMPRESSION: 1. Again seen is a large septated cystic mass within the lower
abdomen and pelvis. Favor cystic ovarian lesion. This may represent
an ovarian cystadenoma or cystadenocarcinoma. The lack of thickened
irregular enhancing septae or mural nodularity favors benign cystic
mass. However, because of the large size of this lesion surgical
consultation is recommended.
2. Small low-attenuation foci within the kidneys and liver are too
small to reliably characterize.

## 2019-12-09 ENCOUNTER — Ambulatory Visit (INDEPENDENT_AMBULATORY_CARE_PROVIDER_SITE_OTHER): Payer: BC Managed Care – PPO | Admitting: Internal Medicine

## 2019-12-11 ENCOUNTER — Ambulatory Visit (INDEPENDENT_AMBULATORY_CARE_PROVIDER_SITE_OTHER): Payer: BC Managed Care – PPO | Admitting: Gastroenterology

## 2019-12-16 ENCOUNTER — Ambulatory Visit (INDEPENDENT_AMBULATORY_CARE_PROVIDER_SITE_OTHER): Payer: BC Managed Care – PPO | Admitting: Gastroenterology

## 2020-02-01 ENCOUNTER — Telehealth: Payer: BC Managed Care – PPO | Admitting: Nurse Practitioner

## 2020-02-01 DIAGNOSIS — J069 Acute upper respiratory infection, unspecified: Secondary | ICD-10-CM

## 2020-02-01 MED ORDER — FLUTICASONE PROPIONATE 50 MCG/ACT NA SUSP: 2.0000 | Freq: Every day | NASAL | 0 refills | Status: DC

## 2020-02-01 MED ORDER — DOXYCYCLINE HYCLATE 100 MG PO TABS: 100.0000 mg | ORAL_TABLET | Freq: Two times a day (BID) | ORAL | 0 refills | Status: AC

## 2020-02-01 NOTE — Progress Notes (Signed)
We are sorry that you are not feeling well.  Here is how we plan to help!  Based on what you have shared with me it looks like you have sinusitis.  Sinusitis is inflammation and infection in the sinus cavities of the head.  Based on your presentation I believe you most likely have Acute Bacterial Sinusitis.  This is an infection caused by bacteria and is treated with antibiotics. I have prescribed Doxycycline 100mg  by mouth twice a day for 10 days. I am also prescribing Fluticasone nasal spray, use 2 sprays in each nostril daily until symptoms improve.  You may use an oral decongestant such as Mucinex D or if you have glaucoma or high blood pressure use plain Mucinex. Saline nasal spray help and can safely be used as often as needed for congestion.  If you develop worsening sinus pain, fever or notice severe headache and vision changes, or if symptoms are not better after completion of antibiotic, please schedule an appointment with a health care provider.    Sinus infections are not as easily transmitted as other respiratory infection, however we still recommend that you avoid close contact with loved ones, especially the very young and elderly.  Remember to wash your hands thoroughly throughout the day as this is the number one way to prevent the spread of infection!  Home Care:  Only take medications as instructed by your medical team.  Complete the entire course of an antibiotic.  Do not take these medications with alcohol.  A steam or ultrasonic humidifier can help congestion.  You can place a towel over your head and breathe in the steam from hot water coming from a faucet.  Avoid close contacts especially the very young and the elderly.  Cover your mouth when you cough or sneeze.  Always remember to wash your hands.   I also recommend getting another COVID test for confirmation based on your symptoms. For your convenience, I am providing a list of the testing sites with [facility] and  in the surrounding communities:   Testing Information: The [place] sites are testing [place].  You can schedule online at [email or web address]  If you do not have access to a smart phone or computer you may call [phone] for an appointment.   Additional testing sites in the Community:  . For [facility] Testing sites in [facility]  FaceUpdate.uy  . For Pop-up testing sites in [place]  BowlDirectory.co.uk  . For [facility] [email or web address]  . For [facility] testing in [place] and [employer] [email or web address]  . For [facility] testing in [facility]   [email or web address]  For  more information about community testing call [phone]  Get Help Right Away If:  You develop worsening fever or sinus pain.  You develop a severe head ache or visual changes.  Your symptoms persist after you have completed your treatment plan.  Make sure you  Understand these instructions.  Will watch your condition.  Will get help right away if you are not doing well or get worse.  Your e-visit answers were reviewed by a board certified advanced clinical practitioner to complete your personal care plan.  Depending on the condition, your plan could have included both over the counter or prescription medications.  If there is a problem please reply  once you have received a response from your provider.  Your safety is important to [place].  If you have drug allergies check your prescription carefully.  You can use MyChart to ask questions about today's visit, request a non-urgent call back, or ask for a work or school  excuse for 24 hours related to this e-Visit. If it has been greater than 24 hours you will need to follow up with your provider, or enter a new e-Visit to address those concerns.  You will get an e-mail in the next two days asking about your experience.  I hope that your e-visit has been valuable and will speed your recovery. Thank you for using e-visits.  I have spent at least 5 minutes reviewing and documenting in the patient's chart.

## 2020-02-16 ENCOUNTER — Other Ambulatory Visit: Payer: Self-pay

## 2020-02-16 ENCOUNTER — Ambulatory Visit: Payer: BC Managed Care – PPO | Admitting: Dermatology

## 2020-02-16 ENCOUNTER — Encounter: Payer: Self-pay | Admitting: Dermatology

## 2020-02-16 DIAGNOSIS — Z85828 Personal history of other malignant neoplasm of skin: Secondary | ICD-10-CM | POA: Diagnosis not present

## 2020-02-16 DIAGNOSIS — L570 Actinic keratosis: Secondary | ICD-10-CM

## 2020-02-16 DIAGNOSIS — L821 Other seborrheic keratosis: Secondary | ICD-10-CM | POA: Diagnosis not present

## 2020-02-16 DIAGNOSIS — D1801 Hemangioma of skin and subcutaneous tissue: Secondary | ICD-10-CM

## 2020-02-16 DIAGNOSIS — Z1283 Encounter for screening for malignant neoplasm of skin: Secondary | ICD-10-CM

## 2020-02-16 DIAGNOSIS — R21 Rash and other nonspecific skin eruption: Secondary | ICD-10-CM

## 2020-02-16 DIAGNOSIS — L820 Inflamed seborrheic keratosis: Secondary | ICD-10-CM

## 2020-02-16 DIAGNOSIS — D485 Neoplasm of uncertain behavior of skin: Secondary | ICD-10-CM

## 2020-02-16 MED ORDER — HYDROCORTISONE 2.5 % EX CREA: TOPICAL_CREAM | Freq: Two times a day (BID) | CUTANEOUS | 3 refills | Status: DC | PRN

## 2020-02-16 NOTE — Patient Instructions (Signed)

## 2020-02-17 ENCOUNTER — Encounter: Payer: Self-pay | Admitting: Dermatology

## 2020-02-17 NOTE — Progress Notes (Signed)
Follow-Up Visit   Subjective  Danielle Gutierrez is a 60 y.o. female who presents for the following: Annual Exam (Mole left upper quad abdomen x years wants biopsy rub area, tags under bra patient aware tags require waiver ,(patient has a list)).  Growing spot left upper abdomen plus multiple other skin issues to discuss Location:  Duration:  Quality:  Associated Signs/Symptoms: Modifying Factors:  Severity:  Timing: Context:   Objective  Well appearing patient in no apparent distress; mood and affect are within normal limits. Objective  Chest - Medial [facility]): No sign recurrence, minor scar.  Objective  Chest - Medial [facility]): Chest, back, and abdomen, and all sun exposed areas examined.  No atypical moles or melanoma.  Objective  Mid Back: Multiple 2 to 3 mm smooth red papules  Objective  Left Abdomen (side) - Upper: Flat topped textured brown papules  Objective  Left Abdomen (side) - Upper: Pink 4 mm crust, I SK versus CIS  Objective  Right Upper Vermilion Lip: Pink hornlike 3 mm papule  Objective  Left Antecubital Fossa: At the conclusion of the visit time he mentioned that she has a recurring rash mainly on the mid left arm associated with significant itching and sometimes visible red bumps.  This may predominantly occur during the summer.  Today there are no visible signs of inflammation, but the history could fit either brachial radial pruritus or polymorphic light eruption.    A full examination was performed including scalp, head, eyes, ears, nose, lips, neck, chest, axillae, abdomen, back, buttocks, bilateral upper extremities, bilateral lower extremities, hands, feet, fingers, toes, fingernails, and toenails. All findings within normal limits unless otherwise noted below.   Assessment & Plan    History of basal cell cancer Chest - Medial [facility])  Recheck as needed change  Screening exam for skin cancer Chest - Medial [facility])  Annual  skin examination.  Encouraged to check her own skin twice annually with spouse.  Continued ultraviolet protection.  Cherry angioma Mid Back  Leave if stable  Seborrheic keratosis Left Abdomen (side) - Upper  Leave if stable  Neoplasm of uncertain behavior of skin Left Abdomen (side) - Upper  Skin / nail biopsy Type of biopsy: tangential   Informed consent: discussed and consent obtained   Timeout: patient name, date of birth, surgical site, and procedure verified   Anesthesia: the lesion was anesthetized in a standard fashion   Anesthetic:  1% lidocaine w/ epinephrine 1-100,000 local infiltration Instrument used: flexible razor blade   Hemostasis achieved with: aluminum chloride and electrodesiccation   Outcome: patient tolerated procedure well   Post-procedure details: wound care instructions given    Specimen 1 - Surgical pathology Differential Diagnosis: seb k   Check Margins: No  AK (actinic keratosis) Right Upper Vermilion Lip  Destruction of lesion - Right Upper Vermilion Lip Complexity: simple   Destruction method: cryotherapy   Informed consent: discussed and consent obtained   Timeout:  patient name, date of birth, surgical site, and procedure verified Lesion destroyed using liquid nitrogen: Yes   Cryotherapy cycles:  3 Outcome: patient tolerated procedure well with no complications   Post-procedure details: wound care instructions given    Rash and other nonspecific skin eruption Left Antecubital Fossa  Reexamine area of rash flares.  She may certainly use her hydrocortisone prescription on this area.  Ordered Medications: hydrocortisone 2.5 % cream      I, Lavonna Monarch, MD, have reviewed all documentation for this visit.  The documentation  on 02/17/20 for the exam, diagnosis, procedures, and orders are all accurate and complete.

## 2020-02-20 ENCOUNTER — Encounter: Payer: Self-pay | Admitting: Dermatology

## 2020-02-23 ENCOUNTER — Other Ambulatory Visit: Payer: Self-pay | Admitting: Nurse Practitioner

## 2020-02-23 DIAGNOSIS — J069 Acute upper respiratory infection, unspecified: Secondary | ICD-10-CM

## 2020-03-08 ENCOUNTER — Encounter (INDEPENDENT_AMBULATORY_CARE_PROVIDER_SITE_OTHER): Payer: Self-pay | Admitting: Gastroenterology

## 2020-03-08 ENCOUNTER — Telehealth (INDEPENDENT_AMBULATORY_CARE_PROVIDER_SITE_OTHER): Payer: BC Managed Care – PPO | Admitting: Gastroenterology

## 2020-03-08 ENCOUNTER — Other Ambulatory Visit: Payer: Self-pay

## 2020-03-08 VITALS — Ht 64.0 in | Wt 148.0 lb

## 2020-03-08 DIAGNOSIS — K588 Other irritable bowel syndrome: Secondary | ICD-10-CM | POA: Diagnosis not present

## 2020-03-08 DIAGNOSIS — R7401 Elevation of levels of liver transaminase levels: Secondary | ICD-10-CM | POA: Diagnosis not present

## 2020-03-08 NOTE — Progress Notes (Signed)
Maylon Peppers, M.D. Gastroenterology & Hepatology [facility] [address] [place], [place] [address] Primary Care Physician: Isaac Bliss, Rayford Halsted, MD [place] [id number]  This is a virtual video visit.  It required patient-provider interaction for the medical decision making as documented below. The patient has consented and agreed to proceed with a Telehealth encounter given the current Coronavirus pandemic.  VIRTUAL VISIT NOTE Patient location: home Provider location: office  I will communicate my assessment and recommendations to the referring MD via EMR. Note: Occasional unusual wording and randomly placed punctuation marks may result from the use of speech recognition technology to transcribe this document"   Problems: 1. IBS-M 2. Fatty liver  History of Present Illness: Danielle Gutierrez is a 60 y.o. female with PMH IBS, who presents for follow up of IBS.  The patient was last seen on 12/09/2018. At that time, the patient was prescribed 10 mg of dicyclomine twice a day as needed for his abdominal pain.  She was also ordered to have a repeat CT of the abdomen to evaluate small lesions in her liver.  Most recent CT of the abdomen and pelvis with and without IV contrast was performed on 01/01/2019 which showed stable 0.9 x 0.7 cm liver lesion, which was actually smaller to the lesion previously seen.  The patient denies having any complaints at the moment. Was prescribed Bentyl last year but has not had to take it as she has not presented any more abdominal pain episodes. Sometimes has some urgency but nothing concerning for her. The patient denies having any nausea, vomiting, fever, chills, hematochezia, melena, hematemesis, abdominal distention, abdominal pain, diarrhea, jaundice, pruritus or weight loss.  Most recent labs from 06/26/2018, ALT 15 AST 19 alb 4.2 alk phop 51 WBC 6.0 hb 1.8 PLT 367.  She reports that she had recent blood testing performed in mid 2021 that showed AST 34 and ALT 23.  Last Colonoscopy: 2021 - small cecal polyp (TA), repeat in 7 years  Past Medical History: Past Medical History:  Diagnosis Date  . Basal cell carcinoma 06/01/2009    tx cx3 87f  . Serous cystadenoma of ovary     Past Surgical History: Past Surgical History:  Procedure Laterality Date  . ABDOMINAL HYSTERECTOMY    . CESAREAN SECTION  1985  . COLONOSCOPY  05/2014   [facility]  . COLONOSCOPY N/A 06/12/2019   Procedure: COLONOSCOPY;  Surgeon: RRogene Houston MD;  [facility];  Service: Endoscopy;  Laterality: N/A;  11:15  . POLYPECTOMY  06/12/2019   Procedure: POLYPECTOMY;  Surgeon: RRogene Houston MD;  [facility];  Service: Endoscopy;;  . Uterine ablasion  2009  2009    Family History: Family History  Adopted: Yes  Problem Relation Age of Onset  . Breast cancer Maternal Aunt 75  . Arthritis Mother   . Peripheral vascular disease Mother     Social History: Social History   Tobacco Use  Smoking Status Never Smoker  Smokeless Tobacco Never Used   Social History   Substance and Sexual Activity  Alcohol Use Yes   Comment: Social Drink   Social History   Substance and Sexual Activity  Drug Use Never    Allergies: Allergies  Allergen Reactions  . Penicillins Rash    Medications: Current Outpatient Medications  Medication Sig Dispense Refill  . fluticasone (FLONASE) 50 MCG/ACT nasal spray Place 2 sprays into both nostrils daily for  10 days. 16 g 0  . hydrocortisone 2.5 % cream Apply topically 2 (two) times daily as needed (Rash). 30 g 3  . levocetirizine (XYZAL) 5 MG tablet Take 5 mg by mouth daily as needed for allergies.    Marland Kitchen dicyclomine (BENTYL) 10 MG capsule TAKE 1 CAPSULE (10 MG TOTAL) BY MOUTH 2 (TWO) TIMES DAILY AS NEEDED FOR SPASMS. (Patient not taking: No sig reported) 180 capsule 0   No current facility-administered medications  for this visit.    Review of Systems: GENERAL: negative for malaise, night sweats HEENT: No changes in hearing or vision, no nose bleeds or other nasal problems. NECK: Negative for lumps, goiter, pain and significant neck swelling RESPIRATORY: Negative for cough, wheezing CARDIOVASCULAR: Negative for chest pain, leg swelling, palpitations, orthopnea GI: SEE HPI MUSCULOSKELETAL: Negative for joint pain or swelling, back pain, and muscle pain. SKIN: Negative for lesions, rash PSYCH: Negative for sleep disturbance, mood disorder and recent psychosocial stressors. HEMATOLOGY Negative for prolonged bleeding, bruising easily, and swollen nodes. ENDOCRINE: Negative for cold or heat intolerance, polyuria, polydipsia and goiter. NEURO: negative for tremor, gait imbalance, syncope and seizures. The remainder of the review of systems is noncontributory.   Physical Exam: GENERAL: The patient is AO x3, in no acute distress. HEENT: Head is normocephalic and atraumatic. EOMI are intact. Mouth is well hydrated and without lesions. LUNGS: Adequate chest expansion. Auscultation could not be performed remotely. HEART: Auscultation could not be performed remotely. ABDOMEN: Nondistended. No masses were observed. EXTREMITIES: Without any cyanosis, clubbing, rash, lesions or edema. NEUROLOGIC: AOx3, no focal motor deficit. SKIN: no jaundice, no rashes  Imaging/Labs: as above  I personally reviewed and interpreted the available labs, imaging and endoscopic files.  Impression and Plan: Danielle Gutierrez is a 60 y.o. female with PMH IBS, who presents for follow up of IBS.  The patient has presented adequate control of her symptoms even without taking the medication.  I advised her she could take the Bentyl as needed if she presents any episodes in the future but no further management is warranted at this point.  In terms of her liver lesions, these have been stable and are likely related to hemangiomas  or benign cysts given the stability of the size when compared to prior examinations.  She had mild elevation of her AST which along with fatty liver, goes along with alcohol intake which can cause some steatohepatitis.  We will recheck CBC and CMP today.  -Check CBC and CMP -Take Bentyl as needed for abdominal pain  All questions were answered.      Face-to-face time: I spent a total of  20 minutes  Maylon Peppers, MD Gastroenterology and Hepatology [facility]

## 2020-03-08 NOTE — Patient Instructions (Signed)
Perform blood workup Take Bentyl as needed for abdominal pain

## 2020-03-09 ENCOUNTER — Encounter (INDEPENDENT_AMBULATORY_CARE_PROVIDER_SITE_OTHER): Payer: Self-pay

## 2020-03-09 ENCOUNTER — Encounter (INDEPENDENT_AMBULATORY_CARE_PROVIDER_SITE_OTHER): Payer: Self-pay | Admitting: Gastroenterology

## 2020-03-16 LAB — COMPREHENSIVE METABOLIC PANEL
AG Ratio: 1.6 (calc) (ref 1.0–2.5)
ALT: 18 U/L (ref 6–29)
AST: 22 U/L (ref 10–35)
Albumin: 4.2 g/dL (ref 3.6–5.1)
Alkaline phosphatase (APISO): 69 U/L (ref 37–153)
BUN: 18 mg/dL (ref 7–25)
CO2: 27 mmol/L (ref 20–32)
Calcium: 9.3 mg/dL (ref 8.6–10.4)
Chloride: 105 mmol/L (ref 98–110)
Creat: 0.92 mg/dL (ref 0.50–0.99)
Globulin: 2.7 g/dL (calc) (ref 1.9–3.7)
Glucose, Bld: 91 mg/dL (ref 65–139)
Potassium: 4.6 mmol/L (ref 3.5–5.3)
Sodium: 139 mmol/L (ref 135–146)
Total Bilirubin: 0.9 mg/dL (ref 0.2–1.2)
Total Protein: 6.9 g/dL (ref 6.1–8.1)

## 2020-03-16 LAB — CBC WITH DIFFERENTIAL/PLATELET
Absolute Monocytes: 465 cells/uL (ref 200–950)
Basophils Absolute: 41 cells/uL (ref 0–200)
Basophils Relative: 0.9 %
Eosinophils Absolute: 193 cells/uL (ref 15–500)
Eosinophils Relative: 4.2 %
HCT: 31 % — ABNORMAL LOW (ref 35.0–45.0)
Hemoglobin: 10.7 g/dL — ABNORMAL LOW (ref 11.7–15.5)
Lymphs Abs: 2107 cells/uL (ref 850–3900)
MCH: 30.9 pg (ref 27.0–33.0)
MCHC: 34.5 g/dL (ref 32.0–36.0)
MCV: 89.6 fL (ref 80.0–100.0)
MPV: 9.3 fL (ref 7.5–12.5)
Monocytes Relative: 10.1 %
Neutro Abs: 1794 cells/uL (ref 1500–7800)
Neutrophils Relative %: 39 %
Platelets: 327 10*3/uL (ref 140–400)
RBC: 3.46 10*6/uL — ABNORMAL LOW (ref 3.80–5.10)
RDW: 12.5 % (ref 11.0–15.0)
Total Lymphocyte: 45.8 %
WBC: 4.6 10*3/uL (ref 3.8–10.8)

## 2020-04-26 ENCOUNTER — Telehealth: Payer: BC Managed Care – PPO | Admitting: Family Medicine

## 2020-04-26 ENCOUNTER — Encounter: Payer: Self-pay | Admitting: Family Medicine

## 2020-04-26 ENCOUNTER — Telehealth (INDEPENDENT_AMBULATORY_CARE_PROVIDER_SITE_OTHER): Payer: BC Managed Care – PPO | Admitting: Family Medicine

## 2020-04-26 DIAGNOSIS — J069 Acute upper respiratory infection, unspecified: Secondary | ICD-10-CM

## 2020-04-26 DIAGNOSIS — J040 Acute laryngitis: Secondary | ICD-10-CM | POA: Diagnosis not present

## 2020-04-26 DIAGNOSIS — J0110 Acute frontal sinusitis, unspecified: Secondary | ICD-10-CM | POA: Diagnosis not present

## 2020-04-26 DIAGNOSIS — R0982 Postnasal drip: Secondary | ICD-10-CM | POA: Diagnosis not present

## 2020-04-26 MED ORDER — AZITHROMYCIN 250 MG PO TABS: ORAL_TABLET | ORAL | 0 refills | Status: DC

## 2020-04-26 NOTE — Progress Notes (Signed)
Virtual Visit via Video Note  I connected with Danielle Gutierrez on 04/26/20 at  2:30 PM EDT by a video enabled telemedicine application 2/2 [id number]-01 pandemic and verified that I am speaking with the correct person using two identifiers.  Location patient: home Location provider:work or home office Persons participating in the virtual visit: patient, provider  I discussed the limitations of evaluation and management by telemedicine and the availability of in person appointments. The patient expressed understanding and agreed to proceed.   HPI: Pt with a "really bad HA", sore throat, and horse voice.  PT had nasal congestion last wk and noted post nasal drainage last Thursday.  P had a negative home COVID test.  Pt endorses frontal pain and pressure.  Also with ear pressure.  Pt tried Xyzal, generic flonase, and ibuprofen.  Pt coughing up thick green mucus if able to get anything up.  Denies sick contacts.  Able to eat and drink ok.   ROS: See pertinent positives and negatives per HPI.  Past Medical History:  Diagnosis Date  . Basal cell carcinoma 06/01/2009    tx cx3 29fu  . Serous cystadenoma of ovary     Past Surgical History:  Procedure Laterality Date  . ABDOMINAL HYSTERECTOMY    . CESAREAN SECTION  1985  . COLONOSCOPY  05/2014   [facility]  . COLONOSCOPY N/A 06/12/2019   Procedure: COLONOSCOPY;  Surgeon: Rogene Houston, MD;  [facility];  Service: Endoscopy;  Laterality: N/A;  11:15  . POLYPECTOMY  06/12/2019   Procedure: POLYPECTOMY;  Surgeon: Rogene Houston, MD;  [facility];  Service: Endoscopy;;  . Uterine ablasion  2009  2009    Family History  Problem Relation Age of Onset  . Breast cancer Maternal Aunt 75  . Arthritis Mother   . Peripheral vascular disease Mother     Current Outpatient Medications:  .  hydrocortisone 2.5 % cream, Apply topically 2 (two) times daily as needed (Rash)., Disp: 30 g, Rfl: 3 .  levocetirizine (XYZAL) 5 MG tablet,  Take 5 mg by mouth daily as needed for allergies., Disp: , Rfl:  .  dicyclomine (BENTYL) 10 MG capsule, TAKE 1 CAPSULE (10 MG TOTAL) BY MOUTH 2 (TWO) TIMES DAILY AS NEEDED FOR SPASMS. (Patient not taking: No sig reported), Disp: 180 capsule, Rfl: 0 .  fluticasone (FLONASE) 50 MCG/ACT nasal spray, Place 2 sprays into both nostrils daily for 10 days., Disp: 16 g, Rfl: 0  EXAM:  VITALS per patient if applicable: RR between 02-72 bpm  GENERAL: alert, oriented, appears well and in no acute distress  HEENT: atraumatic, conjunctiva clear, no obvious abnormalities on inspection of external nose and ears  NECK: normal movements of the head and neck  LUNGS: on inspection no signs of respiratory distress, breathing rate appears normal, no obvious gross SOB, gasping or wheezing  CV: no obvious cyanosis  MS: moves all visible extremities without noticeable abnormality  PSYCH/NEURO: pleasant and cooperative, no obvious depression or anxiety, speech and thought processing grossly intact  ASSESSMENT AND PLAN:  Discussed the following assessment and plan:  Acute frontal sinusitis, recurrence not specified  -Continue supportive care including Flonase - Plan: azithromycin (ZITHROMAX) 250 MG tablet  Laryngitis -Vocal rest advised -Okay to gargle with warm salt water or Chloraseptic spray  URI with cough and congestion -At home COVID test negative for pt -Continue supportive care including NSAIDs, rest, warm fluids, cough and cold medication -Given precautions  Post-nasal drainage -Okay to continue  Xyzal, Flonase, other supportive care  Follow-up as needed lab   I discussed the assessment and treatment plan with the patient. The patient was provided an opportunity to ask questions and all were answered. The patient agreed with the plan and demonstrated an understanding of the instructions.   The patient was advised to call back or seek an in-person evaluation if the symptoms worsen or if the  condition fails to improve as anticipated.   Billie Ruddy, MD

## 2020-07-05 ENCOUNTER — Other Ambulatory Visit: Payer: Self-pay | Admitting: Internal Medicine

## 2020-07-05 DIAGNOSIS — Z1231 Encounter for screening mammogram for malignant neoplasm of breast: Secondary | ICD-10-CM

## 2020-08-31 ENCOUNTER — Ambulatory Visit
Admission: RE | Admit: 2020-08-31 | Discharge: 2020-08-31 | Disposition: A | Discharge status: Home / Self Care | Payer: BC Managed Care – PPO | Source: Ambulatory Visit | Attending: Internal Medicine | Admitting: Internal Medicine

## 2020-08-31 ENCOUNTER — Other Ambulatory Visit: Payer: Self-pay

## 2020-08-31 DIAGNOSIS — Z1231 Encounter for screening mammogram for malignant neoplasm of breast: Secondary | ICD-10-CM

## 2020-09-30 ENCOUNTER — Encounter: Payer: Self-pay | Admitting: Internal Medicine

## 2020-12-07 ENCOUNTER — Encounter: Payer: Self-pay | Admitting: Internal Medicine

## 2020-12-21 ENCOUNTER — Encounter: Payer: Self-pay | Admitting: Internal Medicine

## 2020-12-21 ENCOUNTER — Ambulatory Visit (INDEPENDENT_AMBULATORY_CARE_PROVIDER_SITE_OTHER): Payer: BC Managed Care – PPO | Admitting: Internal Medicine

## 2020-12-21 ENCOUNTER — Other Ambulatory Visit: Payer: Self-pay | Admitting: Internal Medicine

## 2020-12-21 VITALS — BP 110/70 | HR 60 | Temp 98.3°F | Ht 63.5 in | Wt 154.9 lb

## 2020-12-21 DIAGNOSIS — Z23 Encounter for immunization: Secondary | ICD-10-CM | POA: Diagnosis not present

## 2020-12-21 DIAGNOSIS — N951 Menopausal and female climacteric states: Secondary | ICD-10-CM

## 2020-12-21 DIAGNOSIS — D279 Benign neoplasm of unspecified ovary: Secondary | ICD-10-CM | POA: Diagnosis not present

## 2020-12-21 DIAGNOSIS — Z0000 Encounter for general adult medical examination without abnormal findings: Secondary | ICD-10-CM | POA: Diagnosis not present

## 2020-12-21 LAB — CBC WITH DIFFERENTIAL/PLATELET
Basophils Absolute: 0.1 10*3/uL (ref 0.0–0.1)
Basophils Relative: 0.6 % (ref 0.0–3.0)
Eosinophils Absolute: 0.2 10*3/uL (ref 0.0–0.7)
Eosinophils Relative: 2.2 % (ref 0.0–5.0)
HCT: 33.6 % — ABNORMAL LOW (ref 36.0–46.0)
Hemoglobin: 11.3 g/dL — ABNORMAL LOW (ref 12.0–15.0)
Lymphocytes Relative: 27.6 % (ref 12.0–46.0)
Lymphs Abs: 2.3 10*3/uL (ref 0.7–4.0)
MCHC: 33.7 g/dL (ref 30.0–36.0)
MCV: 92.1 fl (ref 78.0–100.0)
Monocytes Absolute: 0.6 10*3/uL (ref 0.1–1.0)
Monocytes Relative: 6.8 % (ref 3.0–12.0)
Neutro Abs: 5.3 10*3/uL (ref 1.4–7.7)
Neutrophils Relative %: 62.8 % (ref 43.0–77.0)
Platelets: 354 10*3/uL (ref 150.0–400.0)
RBC: 3.65 Mil/uL — ABNORMAL LOW (ref 3.87–5.11)
RDW: 12.8 % (ref 11.5–15.5)
WBC: 8.4 10*3/uL (ref 4.0–10.5)

## 2020-12-21 LAB — COMPREHENSIVE METABOLIC PANEL
ALT: 16 U/L (ref 0–35)
AST: 21 U/L (ref 0–37)
Albumin: 4.4 g/dL (ref 3.5–5.2)
Alkaline Phosphatase: 52 U/L (ref 39–117)
BUN: 20 mg/dL (ref 6–23)
CO2: 25 mEq/L (ref 19–32)
Calcium: 9.6 mg/dL (ref 8.4–10.5)
Chloride: 102 mEq/L (ref 96–112)
Creatinine, Ser: 0.9 mg/dL (ref 0.40–1.20)
GFR: 68.6 mL/min (ref >60.00)
Glucose, Bld: 88 mg/dL (ref 70–99)
Potassium: 4.2 mEq/L (ref 3.5–5.1)
Sodium: 136 mEq/L (ref 135–145)
Total Bilirubin: 1 mg/dL (ref 0.2–1.2)
Total Protein: 7.4 g/dL (ref 6.0–8.3)

## 2020-12-21 LAB — LIPID PANEL
Cholesterol: 211 mg/dL — ABNORMAL HIGH (ref 0–200)
HDL: 75.1 mg/dL (ref >39.00)
LDL Cholesterol: 117 mg/dL — ABNORMAL HIGH (ref 0–99)
NonHDL: 136.09
Total CHOL/HDL Ratio: 3
Triglycerides: 97 mg/dL (ref 0.0–149.0)
VLDL: 19.4 mg/dL (ref 0.0–40.0)

## 2020-12-21 LAB — VITAMIN B12: Vitamin B-12: 231 pg/mL (ref 211–911)

## 2020-12-21 LAB — HEMOGLOBIN A1C: Hgb A1c MFr Bld: 5.5 % (ref 4.6–6.5)

## 2020-12-21 LAB — TSH: TSH: 0.97 u[IU]/mL (ref 0.35–5.50)

## 2020-12-21 LAB — VITAMIN D 25 HYDROXY (VIT D DEFICIENCY, FRACTURES): VITD: 36.61 ng/mL (ref 30.00–100.00)

## 2020-12-21 MED ORDER — PREMARIN 0.625 MG/GM VA CREA: 1.0000 | TOPICAL_CREAM | Freq: Every day | VAGINAL | 12 refills | Status: DC

## 2020-12-21 NOTE — Patient Instructions (Signed)
-  Nice seeing you today!!  -Lab work today; will notify you once results are available.  -Tdap vaccine today.  -Remember your COVID booster.  -Vaginal estrogen cream daily.  -Schedule follow up in 1 year or sooner as needed.

## 2020-12-21 NOTE — Addendum Note (Signed)
Addended by: Westley Hummer B on: 12/21/2020 04:06 PM   Modules accepted: Orders

## 2020-12-21 NOTE — Progress Notes (Signed)
Established Patient Office Visit     This visit occurred during the SARS-CoV-2 public health emergency.  Safety protocols were in place, including screening questions prior to the visit, additional usage of staff PPE, and extensive cleaning of exam room while observing appropriate contact time as indicated for disinfecting solutions.    CC/Reason for Visit: Annual preventive exam  HPI: Danielle Gutierrez is a 60 y.o. female who is coming in today for the above mentioned reasons.  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  Since then she has been having significant issues with vaginal dryness and decreased libido.  She has not spoken with her GYN about this.  She has routine eye and dental care.  She is fasting today.  She is due for Tdap and COVID booster.  She had a colonoscopy in May 2021 and was advised 5-year follow-up.  She had a mammogram in August 2022, she was advised no further need for Pap smears.   Past Medical/Surgical History: Past Medical History:  Diagnosis Date   Basal cell carcinoma 06/01/2009    tx cx3 92fu   Serous cystadenoma of ovary     Past Surgical History:  Procedure Laterality Date   [place]   COLONOSCOPY  05/2014   [facility]   COLONOSCOPY N/A 06/12/2019   Procedure: COLONOSCOPY;  Surgeon: Rogene Houston, MD;  [facility];  Service: Endoscopy;  Laterality: N/A;  11:15   POLYPECTOMY  06/12/2019   Procedure: POLYPECTOMY;  Surgeon: Rogene Houston, MD;  [facility];  Service: Endoscopy;;   Uterine ablasion  2009  2009    Social History:  reports that she has never smoked. She has never used smokeless tobacco. She reports current alcohol use. She reports that she does not use drugs.  Allergies: Allergies  Allergen Reactions   Penicillins Rash    Family History:  Family History   Problem Relation Age of Onset   Breast cancer Maternal Aunt 74   Arthritis Mother    Peripheral vascular disease Mother      Current Outpatient Medications:    conjugated estrogens (PREMARIN) vaginal cream, Place 1 Applicatorful vaginally daily., Disp: 42.5 g, Rfl: 12   fluticasone (FLONASE) 50 MCG/ACT nasal spray, Place 2 sprays into both nostrils daily for 10 days., Disp: 16 g, Rfl: 0   levocetirizine (XYZAL) 5 MG tablet, Take 5 mg by mouth daily as needed for allergies., Disp: , Rfl:   Review of Systems:  Constitutional: Denies fever, chills, diaphoresis, appetite change and fatigue.  HEENT: Denies photophobia, eye pain, redness, hearing loss, ear pain, congestion, sore throat, rhinorrhea, sneezing, mouth sores, trouble swallowing, neck pain, neck stiffness and tinnitus.   Respiratory: Denies SOB, DOE, cough, chest tightness,  and wheezing.   Cardiovascular: Denies chest pain, palpitations and leg swelling.  Gastrointestinal: Denies nausea, vomiting, abdominal pain, diarrhea, constipation, blood in stool and abdominal distention.  Genitourinary: Denies dysuria, urgency, frequency, hematuria, flank pain and difficulty urinating.  Endocrine: Denies: hot or cold intolerance, sweats, changes in hair or nails, polyuria, polydipsia. Musculoskeletal: Denies myalgias, back pain, joint swelling, arthralgias and gait problem.  Skin: Denies pallor, rash and wound.  Neurological: Denies dizziness, seizures, syncope, weakness, light-headedness, numbness and headaches.  Hematological: Denies adenopathy. Easy bruising, personal or family bleeding history  Psychiatric/Behavioral: Denies suicidal ideation, mood changes, confusion, nervousness, sleep disturbance  and agitation    Physical Exam: Vitals:   12/21/20 1330  BP: 110/70  Pulse: 60  Temp: 98.3 F (36.8 C)  TempSrc: Oral  SpO2: 98%  Weight: 154 lb 14.4 oz (70.3 kg)  Height: 5' 3.5" (1.613 m)    Body mass index is 27.01  kg/m.   Constitutional: NAD, calm, comfortable Eyes: PERRL, lids and conjunctivae normal ENMT: Mucous membranes are moist. Posterior pharynx clear of any exudate or lesions. Normal dentition. Tympanic membrane is pearly white, no erythema or bulging. Neck: normal, supple, no masses, no thyromegaly Respiratory: clear to auscultation bilaterally, no wheezing, no crackles. Normal respiratory effort. No accessory muscle use.  Cardiovascular: Regular rate and rhythm, no murmurs / rubs / gallops. No extremity edema. 2+ pedal pulses. No carotid bruits.  Abdomen: no tenderness, no masses palpated. No hepatosplenomegaly. Bowel sounds positive.  Musculoskeletal: no clubbing / cyanosis. No joint deformity upper and lower extremities. Good ROM, no contractures. Normal muscle tone.  Skin: no rashes, lesions, ulcers. No induration Neurologic: CN 2-12 grossly intact. Sensation intact, DTR normal. Strength 5/5 in all 4.  Psychiatric: Normal judgment and insight. Alert and oriented x 3. Normal mood.    Impression and Plan:  Visit for preventive health examination -Recommend routine eye and dental care. -Immunizations: Tdap today, COVID booster at pharmacy, otherwise immunizations are up-to-date and age-appropriate -Healthy lifestyle discussed in detail. -Labs to be updated today. -Colon cancer screening: May/2021, 5-year callback -Breast cancer screening: August 2022 -Cervical cancer screening: No longer does Pap smears -Lung cancer screening: Not applicable -Prostate cancer screening: Not applicable -DEXA: Not applicable   Need for Tdap vaccination -Tdap administered today.  Vaginal dryness, menopausal  - Plan: conjugated estrogens (PREMARIN) vaginal cream to apply daily  Serous cystadenoma of ovary, unspecified laterality     Patient Instructions  -Nice seeing you today!!  -Lab work today; will notify you once results are available.  -Tdap vaccine today.  -Remember your COVID  booster.  -Vaginal estrogen cream daily.  -Schedule follow up in 1 year or sooner as needed.      Lelon Frohlich, MD [facility]

## 2020-12-29 ENCOUNTER — Other Ambulatory Visit: Payer: Self-pay | Admitting: Internal Medicine

## 2020-12-29 DIAGNOSIS — E785 Hyperlipidemia, unspecified: Secondary | ICD-10-CM

## 2021-02-15 ENCOUNTER — Ambulatory Visit: Payer: BC Managed Care – PPO | Admitting: Dermatology

## 2021-02-15 ENCOUNTER — Other Ambulatory Visit: Payer: Self-pay

## 2021-02-15 DIAGNOSIS — L820 Inflamed seborrheic keratosis: Secondary | ICD-10-CM | POA: Diagnosis not present

## 2021-02-15 DIAGNOSIS — B351 Tinea unguium: Secondary | ICD-10-CM

## 2021-02-15 DIAGNOSIS — L815 Leukoderma, not elsewhere classified: Secondary | ICD-10-CM | POA: Diagnosis not present

## 2021-02-15 DIAGNOSIS — D485 Neoplasm of uncertain behavior of skin: Secondary | ICD-10-CM

## 2021-02-15 DIAGNOSIS — D1801 Hemangioma of skin and subcutaneous tissue: Secondary | ICD-10-CM | POA: Diagnosis not present

## 2021-02-15 DIAGNOSIS — Z1283 Encounter for screening for malignant neoplasm of skin: Secondary | ICD-10-CM

## 2021-02-15 MED ORDER — JUBLIA 10 % EX SOLN: 1.0000 "application " | Freq: Every day | CUTANEOUS | 1 refills | Status: DC

## 2021-03-06 ENCOUNTER — Encounter: Payer: Self-pay | Admitting: Dermatology

## 2021-03-06 NOTE — Progress Notes (Signed)
° °  Follow-Up Visit   Subjective  Danielle Gutierrez is a 61 y.o. female who presents for the following: Annual Exam (Here for annual skin exam. Concerns irritated lesion on right lower back. No other concerns. History of BCC. ).  General skin check, growth on back Location:  Duration:  Quality:  Associated Signs/Symptoms: Modifying Factors:  Severity:  Timing: Context:   Objective  Well appearing patient in no apparent distress; mood and affect are within normal limits. Scalp General skin examination: 1 possible nonmelanoma skin cancer left lower back will be biopsied.  No atypical pigmented lesions.  No recurrences.  Left Abdomen (side) - Upper, Mid Back Multiple 2 mm smooth red dermal papules  Right Lower Back 6 mm slightly inflamed pink-tan crust       Left Hallux Distal Dorsal Toe, Right Hallux Interphalangeal Joint of Toe Superficial white discoloration several toenails; compatible with superficial white onychomycosis.  Fingernails uninvolved.    A full examination was performed including scalp, head, eyes, ears, nose, lips, neck, chest, axillae, abdomen, back, buttocks, bilateral upper extremities, bilateral lower extremities, hands, feet, fingers, toes, fingernails, and toenails. All findings within normal limits unless otherwise noted below.  Areas beneath undergarments not fully examined   Assessment & Plan    Encounter for screening for malignant neoplasm of skin Scalp  Annual skin check, patient encouraged to self examine twice annually.  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) - Upper; Mid Back  No intervention necessary  Neoplasm of uncertain behavior of skin Right Lower Back  Skin / nail biopsy Type of biopsy: tangential   Informed consent: discussed and consent obtained   Timeout: patient name, date of birth, surgical site, and procedure verified   Anesthesia: the lesion was anesthetized in a standard  fashion   Anesthetic:  1% lidocaine w/ epinephrine 1-100,000 local infiltration Instrument used: flexible razor blade   Hemostasis achieved with: aluminum chloride and electrodesiccation   Outcome: patient tolerated procedure well   Post-procedure details: wound care instructions given    Specimen 1 - Surgical pathology Differential Diagnosis: bcc vs scc  Check Margins: No  Onychomycosis (2) Right Hallux Interphalangeal Joint of Toe; Left Hallux Distal Dorsal Toe  Discussed multiple treatment options that I would prefer to avoid oral antifungals.  She may choose to use an over-the-counter topical antifungal; this will take a minimal of 3 to 4 months of daily application and complete clearing is the exception.  Related Medications Efinaconazole (JUBLIA) 10 % SOLN Apply 1 application topically daily.  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  The documentation on 03/06/21 for the exam, diagnosis, procedures, and orders are all accurate and complete.

## 2021-03-20 ENCOUNTER — Telehealth: Payer: BC Managed Care – PPO | Admitting: Emergency Medicine

## 2021-03-20 DIAGNOSIS — R051 Acute cough: Secondary | ICD-10-CM | POA: Diagnosis not present

## 2021-03-20 DIAGNOSIS — J019 Acute sinusitis, unspecified: Secondary | ICD-10-CM | POA: Diagnosis not present

## 2021-03-20 MED ORDER — DOXYCYCLINE HYCLATE 100 MG PO TABS: 100.0000 mg | ORAL_TABLET | Freq: Two times a day (BID) | ORAL | 0 refills | Status: AC

## 2021-03-20 MED ORDER — BENZONATATE 100 MG PO CAPS: 100.0000 mg | ORAL_CAPSULE | Freq: Two times a day (BID) | ORAL | 0 refills | Status: DC | PRN

## 2021-03-20 NOTE — Patient Instructions (Signed)
Matthias Hughs, thank you for joining Lestine Box, PA-C for today's virtual visit.  While this provider is not your primary care provider (PCP), if your PCP is located in our provider database this encounter information will be shared with them immediately following your visit.  Consent: (Patient) Danielle Gutierrez provided verbal consent for this virtual visit at the beginning of the encounter.  Current Medications:  Current Outpatient Medications:    benzonatate (TESSALON) 100 MG capsule, Take 1 capsule (100 mg total) by mouth 2 (two) times daily as needed for cough., Disp: 20 capsule, Rfl: 0   doxycycline (VIBRA-TABS) 100 MG tablet, Take 1 tablet (100 mg total) by mouth 2 (two) times daily for 10 days., Disp: 20 tablet, Rfl: 0   Efinaconazole (JUBLIA) 10 % SOLN, Apply 1 application topically daily., Disp: 4 mL, Rfl: 1   estradiol (ESTRACE) 0.1 MG/GM vaginal cream, Place 1 Applicatorful vaginally daily., Disp: 42.5 g, Rfl: 3   fluticasone (FLONASE) 50 MCG/ACT nasal spray, Place 2 sprays into both nostrils daily for 10 days., Disp: 16 g, Rfl: 0   levocetirizine (XYZAL) 5 MG tablet, Take 5 mg by mouth daily as needed for allergies., Disp: , Rfl:    Medications ordered in this encounter:  Meds ordered this encounter  Medications   doxycycline (VIBRA-TABS) 100 MG tablet    Sig: Take 1 tablet (100 mg total) by mouth 2 (two) times daily for 10 days.    Dispense:  20 tablet    Refill:  0    Order Specific Question:   Supervising Provider    Answer:   MILLER, BRIAN [[id number]]   benzonatate (TESSALON) 100 MG capsule    Sig: Take 1 capsule (100 mg total) by mouth 2 (two) times daily as needed for cough.    Dispense:  20 capsule    Refill:  0    Order Specific Question:   Supervising Provider    Answer:   Sabra Heck, [place]     *If you need refills on other medications prior to your next appointment, please contact your pharmacy*  Follow-Up: Call back or seek an in-person  evaluation if the symptoms worsen or if the condition fails to improve as anticipated.  Other Instructions Get plenty of rest and push fluids Doxycycline prescribed for sinus infection Use zyrtec for nasal congestion, runny nose, and/or sore throat Use flonase for nasal congestion and runny nose Use medications daily for symptom relief Use OTC medications like ibuprofen or tylenol as needed fever or pain Follow up with PCP for recheck and ensure symptoms are improving Follow up in person at urgent care or go to the ED if you have any new or worsening symptoms such as fever, worsening cough, shortness of breath, chest tightness, chest pain, turning blue, changes in mental status, etc...     If you have been instructed to have an in-person evaluation today at a local Urgent Care facility, please use the link below. It will take you to a list of all of our available [facility], including address, phone number and hours of operation. Please do not delay care.  [facility]  If you or a family member do not have a primary care provider, use the link below to schedule a visit and establish care. When you choose a [facility] primary care physician or advanced practice provider, you gain a long-term partner in health. Find a Primary Care Provider  Learn more about 's in-office and  virtual care options: [facility]

## 2021-03-20 NOTE — Progress Notes (Signed)
Virtual Visit Consent   Danielle Gutierrez, you are scheduled for a virtual visit with a [facility] provider today.     Just as with appointments in the office, your consent must be obtained to participate.  Your consent will be active for this visit and any virtual visit you may have with one of our providers in the next 365 days.     If you have a MyChart account, a copy of this consent can be sent to you electronically.  All virtual visits are billed to your insurance company just like a traditional visit in the office.    As this is a virtual visit, video technology does not allow for your provider to perform a traditional examination.  This may limit your provider's ability to fully assess your condition.  If your provider identifies any concerns that need to be evaluated in person or the need to arrange testing (such as labs, EKG, etc.), we will make arrangements to do so.     Although advances in technology are sophisticated, we cannot ensure that it will always work on either your end or our end.  If the connection with a video visit is poor, the visit may have to be switched to a telephone visit.  With either a video or telephone visit, we are not always able to ensure that we have a secure connection.     I need to obtain your verbal consent now.   Are you willing to proceed with your visit today? Yes   Danielle Gutierrez has provided verbal consent on 03/20/2021 for a virtual visit (video or telephone).   Danielle Gutierrez, Danielle Gutierrez   Date: 03/20/2021 9:54 AM   Virtual Visit via Video Note   I, Danielle Gutierrez, connected with  Danielle Gutierrez  ([id number], 1960-08-12) on 03/20/21 at 10:00 AM EST by a video-enabled telemedicine application and verified that I am speaking with the correct person using two identifiers.  Location: Patient: Virtual Visit Location Patient: Home Provider: Virtual Visit Location Provider: Home Office   I discussed the limitations of evaluation and  management by telemedicine and the availability of in person appointments. The patient expressed understanding and agreed to proceed.    History of Present Illness:Danielle Gutierrez is a 61 y.o. who identifies as a female who was assigned female at birth, and is being seen today for congestion, PND, productive cough with green/ yellow phlegm, hoarseness x 5 days.  Reports sick exposure to grandson.  Covid test this morning negative.  Has tried OTC medications without relief.  Denies aggravating factors.  Reports previous symptoms in the past with sinus infection.   Denies fever, chills, SOB, wheezing, chest pain, nausea, changes in bowel or bladder habits.    ROS: As per HPI.  All other pertinent ROS negative.     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   Serous cystadenoma of ovary     Allergies:  Allergies  Allergen Reactions   Penicillins Rash   Medications:  Current Outpatient Medications:    benzonatate (TESSALON) 100 MG capsule, Take 1 capsule (100 mg total) by mouth 2 (two) times daily as needed for cough., Disp: 20 capsule, Rfl: 0   doxycycline (VIBRA-TABS) 100 MG tablet, Take 1 tablet (100 mg total) by  mouth 2 (two) times daily for 10 days., Disp: 20 tablet, Rfl: 0   Efinaconazole (JUBLIA) 10 % SOLN, Apply 1 application topically daily., Disp: 4 mL, Rfl: 1   estradiol (ESTRACE) 0.1 MG/GM vaginal cream, Place 1 Applicatorful vaginally daily., Disp: 42.5 g, Rfl: 3   fluticasone (FLONASE) 50 MCG/ACT nasal spray, Place 2 sprays into both nostrils daily for 10 days., Disp: 16 g, Rfl: 0   levocetirizine (XYZAL) 5 MG tablet, Take 5 mg by mouth daily as needed for allergies., Disp: , Rfl:   Observations/Objective: Patient is well-developed, well-nourished in no acute  distress.  Resting comfortably at home. Appears uncomfortable, but nontoxic Head is normocephalic, atraumatic.  No labored breathing. Speaking in full sentences and tolerating own secretions Speech is clear and coherent with logical content.  Patient is alert and oriented at baseline.    Assessment and Plan: 1. Acute non-recurrent sinusitis, unspecified location  2. Acute cough  Get plenty of rest and push fluids Doxycycline prescribed for sinus infection Use zyrtec for nasal congestion, runny nose, and/or sore throat Use flonase for nasal congestion and runny nose Use medications daily for symptom relief Use OTC medications like ibuprofen or tylenol as needed fever or pain Follow up with PCP for recheck and ensure symptoms are improving Follow up in person at urgent care or go to the ED if you have any new or worsening symptoms such as fever, worsening cough, shortness of breath, chest tightness, chest pain, turning blue, changes in mental status, etc...    Follow Up Instructions: I discussed the assessment and treatment plan with the patient. The patient was provided an opportunity to ask questions and all were answered. The patient agreed with the plan and demonstrated an understanding of the instructions.  A copy of instructions were sent to the patient via MyChart unless otherwise noted below.   The patient was advised to call back or seek an in-person evaluation if the symptoms worsen or if the condition fails to improve as anticipated.  Time:  I spent 5-10 minutes with the patient via telehealth technology discussing the above problems/concerns.    Danielle Gutierrez, Danielle Gutierrez

## 2021-07-19 ENCOUNTER — Other Ambulatory Visit: Payer: Self-pay | Admitting: Internal Medicine

## 2021-07-19 DIAGNOSIS — Z1231 Encounter for screening mammogram for malignant neoplasm of breast: Secondary | ICD-10-CM

## 2021-09-01 ENCOUNTER — Ambulatory Visit
Admission: RE | Admit: 2021-09-01 | Discharge: 2021-09-01 | Disposition: A | Discharge status: Home / Self Care | Payer: BC Managed Care – PPO | Source: Ambulatory Visit | Attending: Internal Medicine | Admitting: Internal Medicine

## 2021-09-01 DIAGNOSIS — Z1231 Encounter for screening mammogram for malignant neoplasm of breast: Secondary | ICD-10-CM

## 2022-02-15 ENCOUNTER — Ambulatory Visit: Payer: BC Managed Care – PPO | Admitting: Dermatology

## 2022-04-12 ENCOUNTER — Encounter: Payer: Self-pay | Admitting: Internal Medicine

## 2022-04-12 ENCOUNTER — Ambulatory Visit (INDEPENDENT_AMBULATORY_CARE_PROVIDER_SITE_OTHER): Payer: BC Managed Care – PPO | Admitting: Internal Medicine

## 2022-04-12 VITALS — BP 120/80 | HR 53 | Temp 98.4°F | Ht 64.5 in | Wt 156.9 lb

## 2022-04-12 DIAGNOSIS — Z0000 Encounter for general adult medical examination without abnormal findings: Secondary | ICD-10-CM

## 2022-04-12 DIAGNOSIS — Z114 Encounter for screening for human immunodeficiency virus [HIV]: Secondary | ICD-10-CM | POA: Diagnosis not present

## 2022-04-12 LAB — VITAMIN B12: Vitamin B-12: 228 pg/mL (ref 211–911)

## 2022-04-12 LAB — CBC WITH DIFFERENTIAL/PLATELET
Basophils Absolute: 0 10*3/uL (ref 0.0–0.1)
Basophils Relative: 0.7 % (ref 0.0–3.0)
Eosinophils Absolute: 0.1 10*3/uL (ref 0.0–0.7)
Eosinophils Relative: 1.8 % (ref 0.0–5.0)
HCT: 33.8 % — ABNORMAL LOW (ref 36.0–46.0)
Hemoglobin: 11.4 g/dL — ABNORMAL LOW (ref 12.0–15.0)
Lymphocytes Relative: 36.8 % (ref 12.0–46.0)
Lymphs Abs: 2.4 10*3/uL (ref 0.7–4.0)
MCHC: 33.8 g/dL (ref 30.0–36.0)
MCV: 91.6 fl (ref 78.0–100.0)
Monocytes Absolute: 0.5 10*3/uL (ref 0.1–1.0)
Monocytes Relative: 7 % (ref 3.0–12.0)
Neutro Abs: 3.5 10*3/uL (ref 1.4–7.7)
Neutrophils Relative %: 53.7 % (ref 43.0–77.0)
Platelets: 367 10*3/uL (ref 150.0–400.0)
RBC: 3.69 Mil/uL — ABNORMAL LOW (ref 3.87–5.11)
RDW: 12.8 % (ref 11.5–15.5)
WBC: 6.6 10*3/uL (ref 4.0–10.5)

## 2022-04-12 LAB — COMPREHENSIVE METABOLIC PANEL
ALT: 16 U/L (ref 0–35)
AST: 19 U/L (ref 0–37)
Albumin: 4.2 g/dL (ref 3.5–5.2)
Alkaline Phosphatase: 60 U/L (ref 39–117)
BUN: 18 mg/dL (ref 6–23)
CO2: 26 mEq/L (ref 19–32)
Calcium: 9.4 mg/dL (ref 8.4–10.5)
Chloride: 106 mEq/L (ref 96–112)
Creatinine, Ser: 0.85 mg/dL (ref 0.40–1.20)
GFR: 72.8 mL/min (ref >60.00)
Glucose, Bld: 81 mg/dL (ref 70–99)
Potassium: 4.3 mEq/L (ref 3.5–5.1)
Sodium: 140 mEq/L (ref 135–145)
Total Bilirubin: 0.9 mg/dL (ref 0.2–1.2)
Total Protein: 7.5 g/dL (ref 6.0–8.3)

## 2022-04-12 LAB — LIPID PANEL
Cholesterol: 233 mg/dL — ABNORMAL HIGH (ref 0–200)
HDL: 75.5 mg/dL (ref >39.00)
LDL Cholesterol: 138 mg/dL — ABNORMAL HIGH (ref 0–99)
NonHDL: 157.52
Total CHOL/HDL Ratio: 3
Triglycerides: 100 mg/dL (ref 0.0–149.0)
VLDL: 20 mg/dL (ref 0.0–40.0)

## 2022-04-12 LAB — TSH: TSH: 1.16 u[IU]/mL (ref 0.35–5.50)

## 2022-04-12 LAB — VITAMIN D 25 HYDROXY (VIT D DEFICIENCY, FRACTURES): VITD: 26.4 ng/mL — ABNORMAL LOW (ref 30.00–100.00)

## 2022-04-12 NOTE — Progress Notes (Signed)
Established Patient Office Visit     CC/Reason for Visit: Annual preventive exam  HPI: Danielle Gutierrez is a 62 y.o. female who is coming in today for the above mentioned reasons.  She has no past medical history of significance.  She has routine eye and dental care.  Has updated all age-appropriate vaccines.  All cancer screenings are up-to-date.   Past Medical/Surgical History: Past Medical History:  Diagnosis Date   Basal cell carcinoma 06/01/2009    tx cx3 63fu   Serous cystadenoma of ovary     Past Surgical History:  Procedure Laterality Date   [place]   COLONOSCOPY  05/2014   [facility]   COLONOSCOPY N/A 06/12/2019   Procedure: COLONOSCOPY;  Surgeon: Rogene Houston, MD;  [facility];  Service: Endoscopy;  Laterality: N/A;  11:15   POLYPECTOMY  06/12/2019   Procedure: POLYPECTOMY;  Surgeon: Rogene Houston, MD;  [facility];  Service: Endoscopy;;   Uterine ablasion  2009  2009    Social History:  reports that she has never smoked. She has never used smokeless tobacco. She reports current alcohol use. She reports that she does not use drugs.  Allergies: Allergies  Allergen Reactions   Penicillins Rash    Family History:  Family History  Problem Relation Age of Onset   Breast cancer Maternal Aunt 60   Arthritis Mother    Peripheral vascular disease Mother      Current Outpatient Medications:    levocetirizine (XYZAL) 5 MG tablet, Take 5 mg by mouth daily as needed for allergies., Disp: , Rfl:    estradiol (ESTRACE) 0.1 MG/GM vaginal cream, Place 1 Applicatorful vaginally daily., Disp: 42.5 g, Rfl: 3   fluticasone (FLONASE) 50 MCG/ACT nasal spray, Place 2 sprays into both nostrils daily for 10 days., Disp: 16 g, Rfl: 0  Review of Systems:  Negative unless indicated in HPI.   Physical Exam: Vitals:   04/12/22 1303  BP: 120/80  Pulse: (!) 53  Temp: 98.4 F (36.9 C)  TempSrc:  Oral  SpO2: 100%  Weight: 156 lb 14.4 oz (71.2 kg)  Height: 5' 4.5" (1.638 m)    Body mass index is 26.52 kg/m.   Physical Exam Vitals reviewed.  Constitutional:      General: She is not in acute distress.    Appearance: Normal appearance. She is not ill-appearing, toxic-appearing or diaphoretic.  HENT:     Head: Normocephalic.     Right Ear: Tympanic membrane, ear canal and external ear normal. There is no impacted cerumen.     Left Ear: Tympanic membrane, ear canal and external ear normal. There is no impacted cerumen.     Nose: Nose normal.     Mouth/Throat:     Mouth: Mucous membranes are moist.     Pharynx: Oropharynx is clear. No oropharyngeal exudate or posterior oropharyngeal erythema.  Eyes:     General: No scleral icterus.       Right eye: No discharge.        Left eye: No discharge.     Conjunctiva/sclera: Conjunctivae normal.     Pupils: Pupils are equal, round, and reactive to light.  Neck:     Vascular: No carotid bruit.  Cardiovascular:     Rate and Rhythm: Normal rate and regular rhythm.     Pulses: Normal pulses.     Heart sounds: Normal heart sounds.  Pulmonary:  Effort: Pulmonary effort is normal. No respiratory distress.     Breath sounds: Normal breath sounds.  Abdominal:     General: Abdomen is flat. Bowel sounds are normal.     Palpations: Abdomen is soft.  Musculoskeletal:        General: Normal range of motion.     Cervical back: Normal range of motion.  Skin:    General: Skin is warm and dry.  Neurological:     General: No focal deficit present.     Mental Status: She is alert and oriented to person, place, and time. Mental status is at baseline.  Psychiatric:        Mood and Affect: Mood normal.        Behavior: Behavior normal.        Thought Content: Thought content normal.        Judgment: Judgment normal.      Impression and Plan:  Encounter for preventive health examination - Plan: CBC with Differential/Platelet,  Comprehensive metabolic panel, Lipid panel, TSH, Vitamin B12, Vitamin D, 25-hydroxy, Vitamin D, 25-hydroxy, Vitamin B12, TSH, Lipid panel, Comprehensive metabolic panel, CBC with Differential/Platelet  Encounter for screening for HIV - Plan: HIV Antibody (routine testing w rflx), HIV Antibody (routine testing w rflx)  -Recommend routine eye and dental care. -Healthy lifestyle discussed in detail. -Labs to be updated today. -Prostate cancer screening: N/A Health Maintenance  Topic Date Due   COVID-19 Vaccine (6 - 2023-24 season) 04/28/2022*   Pap Smear  04/12/2023*   Mammogram  09/02/2023   Colon Cancer Screening  06/12/2026   DTaP/Tdap/Td vaccine (3 - Td or Tdap) 12/22/2030   Flu Shot  Completed   Hepatitis C Screening: USPSTF Recommendation to screen - Ages 18-79 yo.  Completed   HIV Screening  Completed   Zoster (Shingles) Vaccine  Completed   HPV Vaccine  Aged Out  *Topic was postponed. The date shown is not the original due date.         Lelon Frohlich, MD [facility] at [facility]

## 2022-04-13 ENCOUNTER — Other Ambulatory Visit: Payer: Self-pay | Admitting: Internal Medicine

## 2022-04-13 ENCOUNTER — Encounter: Payer: Self-pay | Admitting: Internal Medicine

## 2022-04-13 DIAGNOSIS — E559 Vitamin D deficiency, unspecified: Secondary | ICD-10-CM

## 2022-04-13 DIAGNOSIS — E785 Hyperlipidemia, unspecified: Secondary | ICD-10-CM | POA: Insufficient documentation

## 2022-04-13 DIAGNOSIS — E782 Mixed hyperlipidemia: Secondary | ICD-10-CM

## 2022-04-13 LAB — HIV ANTIBODY (ROUTINE TESTING W REFLEX): HIV 1&2 Ab, 4th Generation: NONREACTIVE

## 2022-04-13 MED ORDER — VITAMIN D (ERGOCALCIFEROL) 1.25 MG (50000 UNIT) PO CAPS: 50000.0000 [IU] | ORAL_CAPSULE | ORAL | 0 refills | Status: AC

## 2022-04-19 ENCOUNTER — Encounter: Payer: Self-pay | Admitting: Internal Medicine

## 2022-06-20 ENCOUNTER — Encounter: Payer: Self-pay | Admitting: Internal Medicine

## 2022-06-28 ENCOUNTER — Other Ambulatory Visit: Payer: Self-pay | Admitting: Internal Medicine

## 2022-06-28 DIAGNOSIS — E559 Vitamin D deficiency, unspecified: Secondary | ICD-10-CM

## 2022-08-02 ENCOUNTER — Other Ambulatory Visit (INDEPENDENT_AMBULATORY_CARE_PROVIDER_SITE_OTHER): Payer: BC Managed Care – PPO

## 2022-08-02 DIAGNOSIS — E559 Vitamin D deficiency, unspecified: Secondary | ICD-10-CM

## 2022-08-02 DIAGNOSIS — E782 Mixed hyperlipidemia: Secondary | ICD-10-CM

## 2022-08-02 LAB — LIPID PANEL
Cholesterol: 220 mg/dL — ABNORMAL HIGH (ref 0–200)
HDL: 61.3 mg/dL (ref >39.00)
LDL Cholesterol: 133 mg/dL — ABNORMAL HIGH (ref 0–99)
NonHDL: 158.28
Total CHOL/HDL Ratio: 4
Triglycerides: 127 mg/dL (ref 0.0–149.0)
VLDL: 25.4 mg/dL (ref 0.0–40.0)

## 2022-08-02 LAB — VITAMIN D 25 HYDROXY (VIT D DEFICIENCY, FRACTURES): VITD: 36.63 ng/mL (ref 30.00–100.00)

## 2022-08-07 ENCOUNTER — Other Ambulatory Visit: Payer: Self-pay | Admitting: Internal Medicine

## 2022-08-07 ENCOUNTER — Encounter: Payer: Self-pay | Admitting: Internal Medicine

## 2022-08-07 DIAGNOSIS — E782 Mixed hyperlipidemia: Secondary | ICD-10-CM

## 2022-08-07 MED ORDER — ATORVASTATIN CALCIUM 20 MG PO TABS: 20.0000 mg | ORAL_TABLET | Freq: Every day | ORAL | 1 refills | Status: DC

## 2022-08-22 ENCOUNTER — Other Ambulatory Visit: Payer: Self-pay | Admitting: Internal Medicine

## 2022-08-22 DIAGNOSIS — Z1231 Encounter for screening mammogram for malignant neoplasm of breast: Secondary | ICD-10-CM

## 2022-09-11 ENCOUNTER — Ambulatory Visit
Admission: RE | Admit: 2022-09-11 | Discharge: 2022-09-11 | Disposition: A | Discharge status: Home / Self Care | Payer: BC Managed Care – PPO | Source: Ambulatory Visit | Attending: Internal Medicine | Admitting: Internal Medicine

## 2022-09-11 DIAGNOSIS — Z1231 Encounter for screening mammogram for malignant neoplasm of breast: Secondary | ICD-10-CM

## 2022-09-13 ENCOUNTER — Ambulatory Visit: Payer: BC Managed Care – PPO

## 2022-11-01 ENCOUNTER — Other Ambulatory Visit (INDEPENDENT_AMBULATORY_CARE_PROVIDER_SITE_OTHER): Payer: BC Managed Care – PPO

## 2022-11-01 DIAGNOSIS — E782 Mixed hyperlipidemia: Secondary | ICD-10-CM | POA: Diagnosis not present

## 2022-11-01 LAB — LIPID PANEL
Cholesterol: 147 mg/dL (ref 0–200)
HDL: 62.7 mg/dL (ref >39.00)
LDL Cholesterol: 64 mg/dL (ref 0–99)
NonHDL: 84.69
Total CHOL/HDL Ratio: 2
Triglycerides: 101 mg/dL (ref 0.0–149.0)
VLDL: 20.2 mg/dL (ref 0.0–40.0)

## 2023-01-31 ENCOUNTER — Other Ambulatory Visit: Payer: Self-pay | Admitting: Internal Medicine

## 2023-01-31 DIAGNOSIS — E782 Mixed hyperlipidemia: Secondary | ICD-10-CM

## 2023-07-30 ENCOUNTER — Other Ambulatory Visit: Payer: Self-pay | Admitting: Internal Medicine

## 2023-07-30 DIAGNOSIS — Z1231 Encounter for screening mammogram for malignant neoplasm of breast: Secondary | ICD-10-CM

## 2023-08-18 ENCOUNTER — Other Ambulatory Visit: Payer: Self-pay | Admitting: Internal Medicine

## 2023-08-18 DIAGNOSIS — E782 Mixed hyperlipidemia: Secondary | ICD-10-CM

## 2023-09-12 ENCOUNTER — Ambulatory Visit (INDEPENDENT_AMBULATORY_CARE_PROVIDER_SITE_OTHER): Payer: Self-pay | Admitting: Internal Medicine

## 2023-09-12 ENCOUNTER — Encounter: Payer: Self-pay | Admitting: Internal Medicine

## 2023-09-12 VITALS — BP 110/70 | HR 65 | Temp 98.2°F | Ht 62.25 in | Wt 158.2 lb

## 2023-09-12 DIAGNOSIS — E559 Vitamin D deficiency, unspecified: Secondary | ICD-10-CM | POA: Diagnosis not present

## 2023-09-12 DIAGNOSIS — Z0000 Encounter for general adult medical examination without abnormal findings: Secondary | ICD-10-CM

## 2023-09-12 DIAGNOSIS — Z23 Encounter for immunization: Secondary | ICD-10-CM

## 2023-09-12 DIAGNOSIS — E782 Mixed hyperlipidemia: Secondary | ICD-10-CM

## 2023-09-12 DIAGNOSIS — Z1231 Encounter for screening mammogram for malignant neoplasm of breast: Secondary | ICD-10-CM

## 2023-09-12 DIAGNOSIS — R319 Hematuria, unspecified: Secondary | ICD-10-CM | POA: Diagnosis not present

## 2023-09-12 LAB — VITAMIN B12: Vitamin B-12: 483 pg/mL (ref 211–911)

## 2023-09-12 LAB — CBC WITH DIFFERENTIAL/PLATELET
Basophils Absolute: 0 K/uL (ref 0.0–0.1)
Basophils Relative: 0.5 % (ref 0.0–3.0)
Eosinophils Absolute: 0.1 K/uL (ref 0.0–0.7)
Eosinophils Relative: 0.7 % (ref 0.0–5.0)
HCT: 35.9 % — ABNORMAL LOW (ref 36.0–46.0)
Hemoglobin: 12 g/dL (ref 12.0–15.0)
Lymphocytes Relative: 34.9 % (ref 12.0–46.0)
Lymphs Abs: 3.1 K/uL (ref 0.7–4.0)
MCHC: 33.3 g/dL (ref 30.0–36.0)
MCV: 91.5 fl (ref 78.0–100.0)
Monocytes Absolute: 0.5 K/uL (ref 0.1–1.0)
Monocytes Relative: 6.1 % (ref 3.0–12.0)
Neutro Abs: 5.2 K/uL (ref 1.4–7.7)
Neutrophils Relative %: 57.8 % (ref 43.0–77.0)
Platelets: 396 K/uL (ref 150.0–400.0)
RBC: 3.93 Mil/uL (ref 3.87–5.11)
RDW: 13 % (ref 11.5–15.5)
WBC: 8.9 K/uL (ref 4.0–10.5)

## 2023-09-12 LAB — COMPREHENSIVE METABOLIC PANEL WITH GFR
ALT: 21 U/L (ref 0–35)
AST: 28 U/L (ref 0–37)
Albumin: 4.6 g/dL (ref 3.5–5.2)
Alkaline Phosphatase: 59 U/L (ref 39–117)
BUN: 14 mg/dL (ref 6–23)
CO2: 26 meq/L (ref 19–32)
Calcium: 9.5 mg/dL (ref 8.4–10.5)
Chloride: 100 meq/L (ref 96–112)
Creatinine, Ser: 0.92 mg/dL (ref 0.40–1.20)
GFR: 65.55 mL/min (ref >60.00)
Glucose, Bld: 83 mg/dL (ref 70–99)
Potassium: 4.3 meq/L (ref 3.5–5.1)
Sodium: 136 meq/L (ref 135–145)
Total Bilirubin: 1.6 mg/dL — ABNORMAL HIGH (ref 0.2–1.2)
Total Protein: 7.8 g/dL (ref 6.0–8.3)

## 2023-09-12 LAB — LIPID PANEL
Cholesterol: 172 mg/dL (ref 0–200)
HDL: 76.4 mg/dL (ref >39.00)
LDL Cholesterol: 82 mg/dL (ref 0–99)
NonHDL: 95.42
Total CHOL/HDL Ratio: 2
Triglycerides: 65 mg/dL (ref 0.0–149.0)
VLDL: 13 mg/dL (ref 0.0–40.0)

## 2023-09-12 LAB — URINALYSIS
Bilirubin Urine: NEGATIVE
Leukocytes,Ua: NEGATIVE
Nitrite: NEGATIVE
Specific Gravity, Urine: <=1.005 — AB (ref 1.000–1.030)
Total Protein, Urine: NEGATIVE
Urine Glucose: NEGATIVE
Urobilinogen, UA: 0.2 (ref 0.0–1.0)
pH: 6 (ref 5.0–8.0)

## 2023-09-12 LAB — POC URINALSYSI DIPSTICK (AUTOMATED)
Bilirubin, UA: NEGATIVE
Blood, UA: POSITIVE
Glucose, UA: NEGATIVE
Ketones, UA: POSITIVE
Leukocytes, UA: NEGATIVE
Nitrite, UA: NEGATIVE
Protein, UA: NEGATIVE
Spec Grav, UA: 1.01 (ref 1.010–1.025)
Urobilinogen, UA: 0.2 U/dL
pH, UA: 6 (ref 5.0–8.0)

## 2023-09-12 LAB — VITAMIN D 25 HYDROXY (VIT D DEFICIENCY, FRACTURES): VITD: 50.1 ng/mL (ref 30.00–100.00)

## 2023-09-12 LAB — TSH: TSH: 1 u[IU]/mL (ref 0.35–5.50)

## 2023-09-12 NOTE — Addendum Note (Signed)
 Addended by: KATHRYNE MILLMAN B on: 09/12/2023 03:53 PM   Modules accepted: Orders

## 2023-09-12 NOTE — Progress Notes (Signed)
 Established Patient Office Visit     CC/Reason for Visit: Welcome to Medicare visit, discuss hematuria  HPI: Danielle Gutierrez is a 62 y.o. female who is coming in today for the above mentioned reasons. Past Medical History is significant for: Hyperlipidemia on atorvastatin .  She was noted to have hematuria last year in the absence of a UTI.  She had no workup.  Today she is again noted to have hematuria, and a recent visit with a gynecologist she was also noted to have hematuria.  This is microscopic.  She is feeling well.  Has routine dental care.  Is due for pneumonia vaccines.  Cancer screening is up-to-date.   Past Medical/Surgical History: Past Medical History:  Diagnosis Date   Allergy 1980   Anemia 2000   Basal cell carcinoma 06/01/2009    tx cx3 86fu   GERD (gastroesophageal reflux disease) 2018   occurs occasionally   Serous cystadenoma of ovary     Past Surgical History:  Procedure Laterality Date   ABDOMINAL HYSTERECTOMY     CESAREAN SECTION  1985   COLONOSCOPY  05/2014   [facility]   COLONOSCOPY N/A 06/12/2019   Procedure: COLONOSCOPY;  Surgeon: Golda Claudis PENNER, MD;  Location: [facility];  Service: Endoscopy;  Laterality: N/A;  11:15   POLYPECTOMY  06/12/2019   Procedure: POLYPECTOMY;  Surgeon: Golda Claudis PENNER, MD;  Location: [facility];  Service: Endoscopy;;   Uterine ablasion  2009  2009    Social History:  reports that she has never smoked. She has never used smokeless tobacco. She reports current alcohol use of about 5.0 standard drinks of alcohol per week. She reports that she does not use drugs.  Allergies: Allergies  Allergen Reactions   Penicillins Rash    Family History:  Family History  Problem Relation Age of Onset   Breast cancer Maternal Aunt 29   Arthritis Mother    Peripheral vascular disease Mother    Depression Mother    Hearing loss Mother    Kidney disease Mother    Miscarriages / [place] Mother    Varicose Veins  Mother    Hearing loss Father    Heart disease Father    Hyperlipidemia Father    Hypertension Father      Current Outpatient Medications:    atorvastatin  (LIPITOR) 20 MG tablet, TAKE 1 TABLET BY MOUTH EVERY DAY, Disp: 90 tablet, Rfl: 0   estradiol (ESTRACE) 0.1 MG/GM vaginal cream, Place 1 Applicatorful vaginally daily., Disp: 42.5 g, Rfl: 3   levocetirizine (XYZAL) 5 MG tablet, Take 5 mg by mouth daily as needed for allergies., Disp: , Rfl:   Review of Systems:  Negative unless indicated in HPI.   Physical Exam: Vitals:   09/12/23 1329  BP: 110/70  Pulse: 65  Temp: 98.2 F (36.8 C)  TempSrc: Oral  SpO2: 98%  Weight: 158 lb 3.2 oz (71.8 kg)  Height: 5' 2.25 (1.581 m)    Body mass index is 28.7 kg/m.   Physical Exam Vitals reviewed.  Constitutional:      General: She is not in acute distress.    Appearance: Normal appearance. She is not ill-appearing, toxic-appearing or diaphoretic.  HENT:     Head: Normocephalic.     Right Ear: Tympanic membrane, ear canal and external ear normal. There is no impacted cerumen.     Left Ear: Tympanic membrane, ear canal and external ear normal. There is no impacted cerumen.  Nose: Nose normal.     Mouth/Throat:     Mouth: Mucous membranes are moist.     Pharynx: Oropharynx is clear. No oropharyngeal exudate or posterior oropharyngeal erythema.  Eyes:     General: No scleral icterus.       Right eye: No discharge.        Left eye: No discharge.     Conjunctiva/sclera: Conjunctivae normal.     Pupils: Pupils are equal, round, and reactive to light.  Neck:     Vascular: No carotid bruit.  Cardiovascular:     Rate and Rhythm: Normal rate and regular rhythm.     Pulses: Normal pulses.     Heart sounds: Normal heart sounds.  Pulmonary:     Effort: Pulmonary effort is normal. No respiratory distress.     Breath sounds: Normal breath sounds.  Abdominal:     General: Abdomen is flat. Bowel sounds are normal.     Palpations:  Abdomen is soft.  Musculoskeletal:        General: Normal range of motion.     Cervical back: Normal range of motion.  Skin:    General: Skin is warm and dry.  Neurological:     General: No focal deficit present.     Mental Status: She is alert and oriented to person, place, and time. Mental status is at baseline.  Psychiatric:        Mood and Affect: Mood normal.        Behavior: Behavior normal.        Thought Content: Thought content normal.        Judgment: Judgment normal.   Welcome to Medicare Visit   1. Risk factors, based on past  M,S,F - Cardiac Risk Factors include: advanced age (>57men, >63 women);dyslipidemia   2.  Physical activities: Dietary issues and exercise activities discussed:      3.  Depression/mood:  Flowsheet Row Office Visit from [facility]/12/2022 in [facility] Total Score 0     4.  ADL's:    09/12/2023    1:23 PM  In your present state of health, do you have any difficulty performing the following activities:  Hearing? 0  Comment ringing in right ear  Vision? 0  Difficulty concentrating or making decisions? 0  Walking or climbing stairs? 0  Dressing or bathing? 0  Doing errands, shopping? 0  Preparing Food and eating ? N  Using the Toilet? N  In the past six months, have you accidently leaked urine? N  Do you have problems with loss of bowel control? N  Managing your Medications? N  Managing your Finances? N  Housekeeping or managing your Housekeeping? N     5.  Fall risk:     03/20/2018   12:59 PM 06/12/2019   10:26 AM 12/21/2020    1:27 PM 04/12/2022    1:07 PM 09/12/2023    1:25 PM  Fall Risk  Falls in the past year? 0   0 0 0  Was there an injury with Fall? 0  0 0 0  Fall Risk Category Calculator 0  0 0 0  Fall Risk Category (Retired) Low   Low     (RETIRED) Patient Fall Risk Level  Low fall risk      Fall risk Follow up    Falls evaluation completed Falls evaluation completed     Data saved  with a previous flowsheet row definition  6.  Home safety: No problems identified   7.  Height weight, and visual acuity: height and weight as above, vision/hearing: No results found.   8.  Counseling: Counseling given: Not Answered    9. Lab orders based on risk factors: Laboratory update will be reviewed   10. Cognitive assessment:        09/12/2023    1:26 PM  6CIT Screen  What Year? 0 points  What month? 0 points  What time? 0 points  Count back from 20 0 points  Months in reverse 0 points  Repeat phrase 0 points  Total Score 0 points     11. Screening: Patient provided with a written and personalized 5-10 year screening schedule in the AVS. Health Maintenance  Topic Date Due   Pneumococcal Vaccine for age over 89 (1 of 1 - PCV) Never done   COVID-19 Vaccine (7 - Moderna risk 2024-25 season) 05/10/2023   Flu Shot  08/24/2023   Mammogram  09/10/2024   Medicare Annual Wellness Visit  09/11/2024   Colon Cancer Screening  06/12/2026   DTaP/Tdap/Td vaccine (3 - Td or Tdap) 12/22/2030   DEXA scan (bone density measurement)  Completed   Hepatitis C Screening  Completed   HIV Screening  Completed   Zoster (Shingles) Vaccine  Completed   Hepatitis B Vaccine  Aged Out   HPV Vaccine  Aged Out   Meningitis B Vaccine  Aged Out    12. Provider List Update: Patient Care Team    Relationship Specialty Notifications Start End  Theophilus Andrews, Tully GRADE, MD PCP - General Internal Medicine  03/20/18   Livingston Rigg, MD Consulting Physician Dermatology  02/16/20      13. Advance Directives: Does Patient Have a Medical Advance Directive?: Yes Type of Advance Directive: Healthcare Power of Attorney Does patient want to make changes to medical advance directive?: No - Patient declined Copy of Healthcare Power of Attorney in Chart?: No - copy requested  14. Opioids: Patient is not on any opioid prescriptions and has no risk factors for a substance use disorder.   15.    Goals      Weight (lb) < 200 lb (90.7 kg)         I have personally reviewed and noted the following in the patient's chart:   Medical and social history Use of alcohol, tobacco or illicit drugs  Current medications and supplements Functional ability and status Nutritional status Physical activity Advanced directives List of other physicians Hospitalizations, surgeries, and ER visits in previous 12 months Vitals Screenings to include cognitive, depression, and falls Referrals and appointments  In addition, I have reviewed and discussed with patient certain preventive protocols, quality metrics, and best practice recommendations. A written personalized care plan for preventive services as well as general preventive health recommendations were provided to patient.   Impression and Plan:  Welcome to Medicare preventive visit  Hematuria, unspecified type -     POCT Urinalysis Dipstick (Automated) -     CBC with Differential/Platelet; Future -     Comprehensive metabolic panel with GFR; Future -     TSH; Future -     Vitamin B12; Future -     Urinalysis; Future -     Urine Culture -     CT ABDOMEN PELVIS WO CONTRAST; Future  Mixed hyperlipidemia -     Lipid panel; Future  Vitamin D  deficiency -     VITAMIN D  25 Hydroxy (Vit-D Deficiency, Fractures); Future  Immunization due   -Recommend routine eye and dental care. -Healthy lifestyle discussed in detail. -Labs to be updated today. -Prostate cancer screening: N/A Health Maintenance  Topic Date Due   Pneumococcal Vaccine for age over 79 (1 of 1 - PCV) Never done   COVID-19 Vaccine (7 - Moderna risk 2024-25 season) 05/10/2023   Flu Shot  08/24/2023   Mammogram  09/10/2024   Medicare Annual Wellness Visit  09/11/2024   Colon Cancer Screening  06/12/2026   DTaP/Tdap/Td vaccine (3 - Td or Tdap) 12/22/2030   DEXA scan (bone density measurement)  Completed   Hepatitis C Screening  Completed   HIV Screening  Completed    Zoster (Shingles) Vaccine  Completed   Hepatitis B Vaccine  Aged Out   HPV Vaccine  Aged Out   Meningitis B Vaccine  Aged Out     - PCV 20 in office today. - Given recurrent hematuria will send for CT abdomen and pelvis, consider referral to urology pending results.  Sent for urine culture.     Tully Theophilus Andrews, MD [facility]

## 2023-09-13 LAB — URINE CULTURE
MICRO NUMBER:: 16858717
Result:: NO GROWTH
SPECIMEN QUALITY:: ADEQUATE

## 2023-09-16 ENCOUNTER — Ambulatory Visit (HOSPITAL_BASED_OUTPATIENT_CLINIC_OR_DEPARTMENT_OTHER)
Admission: RE | Admit: 2023-09-16 | Discharge: 2023-09-16 | Disposition: A | Discharge status: Home / Self Care | Source: Ambulatory Visit | Attending: Internal Medicine | Admitting: Internal Medicine

## 2023-09-16 DIAGNOSIS — R319 Hematuria, unspecified: Secondary | ICD-10-CM | POA: Insufficient documentation

## 2023-09-19 ENCOUNTER — Ambulatory Visit: Payer: Self-pay | Admitting: Internal Medicine

## 2023-09-19 DIAGNOSIS — R9341 Abnormal radiologic findings on diagnostic imaging of renal pelvis, ureter, or bladder: Secondary | ICD-10-CM

## 2023-09-25 ENCOUNTER — Ambulatory Visit
Admission: RE | Admit: 2023-09-25 | Discharge: 2023-09-25 | Disposition: A | Discharge status: Home / Self Care | Payer: Self-pay | Source: Ambulatory Visit | Attending: Internal Medicine | Admitting: Internal Medicine

## 2023-09-25 ENCOUNTER — Ambulatory Visit (HOSPITAL_COMMUNITY)

## 2023-09-25 DIAGNOSIS — Z1231 Encounter for screening mammogram for malignant neoplasm of breast: Secondary | ICD-10-CM

## 2023-09-25 NOTE — Telephone Encounter (Signed)
 Called [phone] to have the CT read.

## 2023-09-26 ENCOUNTER — Encounter: Payer: Self-pay | Admitting: Internal Medicine

## 2023-09-26 DIAGNOSIS — R9341 Abnormal radiologic findings on diagnostic imaging of renal pelvis, ureter, or bladder: Secondary | ICD-10-CM

## 2023-09-26 DIAGNOSIS — D279 Benign neoplasm of unspecified ovary: Secondary | ICD-10-CM

## 2023-09-26 NOTE — Addendum Note (Signed)
 Addended by: KATHRYNE MILLMAN B on: 09/26/2023 11:10 AM   Modules accepted: Orders

## 2023-10-15 ENCOUNTER — Telehealth: Payer: Self-pay | Admitting: *Deleted

## 2023-10-15 NOTE — Telephone Encounter (Signed)
 Labs e-faxed. Left detailed message on machine with Dr Dennard response and to let her know that her results were e-faxed.

## 2023-10-15 NOTE — Telephone Encounter (Signed)
 Copied from CRM #[id number]. Topic: Medical Record Request - Records Request >> Oct 15, 2023 10:02 AM Frederich PARAS wrote: Reason for CRM: pt calling, pcp Estela wanted pt to see a eurologist, they moved, they and need all latest lab work results  faxed to # [phone] [facility]. Pt callback # is [phone]

## 2023-11-28 DIAGNOSIS — R319 Hematuria, unspecified: Secondary | ICD-10-CM | POA: Diagnosis not present

## 2023-11-28 DIAGNOSIS — N814 Uterovaginal prolapse, unspecified: Secondary | ICD-10-CM | POA: Diagnosis not present

## 2023-12-26 DIAGNOSIS — R3129 Other microscopic hematuria: Secondary | ICD-10-CM | POA: Diagnosis not present

## 2024-01-02 DIAGNOSIS — N329 Bladder disorder, unspecified: Secondary | ICD-10-CM | POA: Diagnosis not present

## 2024-01-02 DIAGNOSIS — R3129 Other microscopic hematuria: Secondary | ICD-10-CM | POA: Diagnosis not present
